# Patient Record
Sex: Female | Born: 1945 | Race: Black or African American | Hispanic: No | Smoking: Never smoker
Health system: Southern US, Community
[De-identification: ages and names within clinical notes are randomized; demographics above are authoritative.]

## PROBLEM LIST (undated history)

## (undated) DIAGNOSIS — E669 Obesity, unspecified: Secondary | ICD-10-CM

## (undated) DIAGNOSIS — K219 Gastro-esophageal reflux disease without esophagitis: Secondary | ICD-10-CM

## (undated) DIAGNOSIS — Z860100 Personal history of colon polyps, unspecified: Secondary | ICD-10-CM

## (undated) DIAGNOSIS — F329 Major depressive disorder, single episode, unspecified: Secondary | ICD-10-CM

## (undated) DIAGNOSIS — F419 Anxiety disorder, unspecified: Secondary | ICD-10-CM

## (undated) HISTORY — DX: Anxiety disorder, unspecified: F41.9

## (undated) HISTORY — PX: CARPAL TUNNEL RELEASE: SHX101

## (undated) HISTORY — DX: Personal history of colon polyps, unspecified: Z86.0100

## (undated) HISTORY — DX: Major depressive disorder, single episode, unspecified: F32.9

## (undated) HISTORY — DX: Obesity, unspecified: E66.9

## (undated) HISTORY — DX: Personal history of colonic polyps: Z86.010

## (undated) HISTORY — PX: TUBAL LIGATION: SHX77

## (undated) HISTORY — DX: Gastro-esophageal reflux disease without esophagitis: K21.9

---

## 1999-11-07 ENCOUNTER — Other Ambulatory Visit: Admission: RE | Admit: 1999-11-07 | Discharge: 1999-11-07 | Payer: Self-pay | Admitting: Obstetrics and Gynecology

## 2000-04-27 ENCOUNTER — Encounter: Payer: Self-pay | Admitting: Emergency Medicine

## 2000-04-27 ENCOUNTER — Encounter: Admission: RE | Admit: 2000-04-27 | Discharge: 2000-04-27 | Payer: Self-pay | Admitting: Emergency Medicine

## 2001-08-05 ENCOUNTER — Encounter: Payer: Self-pay | Admitting: Internal Medicine

## 2002-12-21 ENCOUNTER — Ambulatory Visit (HOSPITAL_COMMUNITY): Admission: RE | Admit: 2002-12-21 | Discharge: 2002-12-21 | Payer: Self-pay | Admitting: Endocrinology

## 2002-12-30 ENCOUNTER — Encounter (INDEPENDENT_AMBULATORY_CARE_PROVIDER_SITE_OTHER): Payer: Self-pay | Admitting: *Deleted

## 2002-12-30 ENCOUNTER — Ambulatory Visit (HOSPITAL_COMMUNITY): Admission: RE | Admit: 2002-12-30 | Discharge: 2002-12-30 | Payer: Self-pay | Admitting: Endocrinology

## 2002-12-30 ENCOUNTER — Encounter: Payer: Self-pay | Admitting: Internal Medicine

## 2004-03-03 ENCOUNTER — Encounter: Admission: RE | Admit: 2004-03-03 | Discharge: 2004-03-03 | Payer: Self-pay | Admitting: Emergency Medicine

## 2005-01-27 HISTORY — PX: OTHER SURGICAL HISTORY: SHX169

## 2005-03-31 ENCOUNTER — Ambulatory Visit: Payer: Self-pay | Admitting: Gastroenterology

## 2005-04-11 ENCOUNTER — Encounter: Payer: Self-pay | Admitting: Internal Medicine

## 2005-04-11 ENCOUNTER — Encounter (INDEPENDENT_AMBULATORY_CARE_PROVIDER_SITE_OTHER): Payer: Self-pay | Admitting: *Deleted

## 2005-04-11 ENCOUNTER — Ambulatory Visit: Payer: Self-pay | Admitting: Gastroenterology

## 2005-04-11 LAB — HM COLONOSCOPY

## 2005-10-08 ENCOUNTER — Encounter: Admission: RE | Admit: 2005-10-08 | Discharge: 2005-10-08 | Payer: Self-pay | Admitting: Emergency Medicine

## 2005-11-06 ENCOUNTER — Encounter: Admission: RE | Admit: 2005-11-06 | Discharge: 2005-11-06 | Payer: Self-pay | Admitting: Emergency Medicine

## 2006-01-27 HISTORY — PX: REPLACEMENT TOTAL KNEE: SUR1224

## 2006-03-18 ENCOUNTER — Encounter: Admission: RE | Admit: 2006-03-18 | Discharge: 2006-03-18 | Payer: Self-pay | Admitting: Emergency Medicine

## 2006-08-04 ENCOUNTER — Encounter: Admission: RE | Admit: 2006-08-04 | Discharge: 2006-08-04 | Payer: Self-pay | Admitting: Emergency Medicine

## 2006-08-27 ENCOUNTER — Ambulatory Visit: Payer: Self-pay

## 2007-03-26 ENCOUNTER — Encounter: Payer: Self-pay | Admitting: Internal Medicine

## 2007-05-27 ENCOUNTER — Encounter: Admission: RE | Admit: 2007-05-27 | Discharge: 2007-05-27 | Payer: Self-pay | Admitting: Emergency Medicine

## 2008-01-14 ENCOUNTER — Encounter: Payer: Self-pay | Admitting: Internal Medicine

## 2008-04-21 DIAGNOSIS — Z860100 Personal history of colon polyps, unspecified: Secondary | ICD-10-CM | POA: Insufficient documentation

## 2008-05-11 ENCOUNTER — Ambulatory Visit: Payer: Self-pay | Admitting: Internal Medicine

## 2008-05-12 ENCOUNTER — Ambulatory Visit: Payer: Self-pay | Admitting: Internal Medicine

## 2008-05-12 LAB — CONVERTED CEMR LAB: Vit D, 25-Hydroxy: 35 ng/mL (ref 30–89)

## 2008-05-15 LAB — CONVERTED CEMR LAB
ALT: 11 units/L (ref 0–35)
AST: 19 units/L (ref 0–37)
Albumin: 4.1 g/dL (ref 3.5–5.2)
Alkaline Phosphatase: 67 units/L (ref 39–117)
BUN: 15 mg/dL (ref 6–23)
Basophils Absolute: 0 10*3/uL (ref 0.0–0.1)
Basophils Relative: 0.9 % (ref 0.0–3.0)
Bilirubin Urine: NEGATIVE
Bilirubin, Direct: 0.1 mg/dL (ref 0.0–0.3)
CO2: 31 meq/L (ref 19–32)
Calcium: 9.4 mg/dL (ref 8.4–10.5)
Chloride: 109 meq/L (ref 96–112)
Cholesterol: 189 mg/dL (ref 0–200)
Creatinine, Ser: 0.6 mg/dL (ref 0.4–1.2)
Eosinophils Absolute: 0.1 10*3/uL (ref 0.0–0.7)
Eosinophils Relative: 1.4 % (ref 0.0–5.0)
GFR calc non Af Amer: 130.04 mL/min (ref >60)
Glucose, Bld: 98 mg/dL (ref 70–99)
HCT: 38.8 % (ref 36.0–46.0)
HDL: 52.2 mg/dL (ref >39.00)
Hemoglobin, Urine: NEGATIVE
Hemoglobin: 12.8 g/dL (ref 12.0–15.0)
Ketones, ur: NEGATIVE mg/dL
LDL Cholesterol: 129 mg/dL — ABNORMAL HIGH (ref 0–99)
Leukocytes, UA: NEGATIVE
Lymphocytes Relative: 33 % (ref 12.0–46.0)
Lymphs Abs: 1.8 10*3/uL (ref 0.7–4.0)
MCHC: 33.1 g/dL (ref 30.0–36.0)
MCV: 85.6 fL (ref 78.0–100.0)
Monocytes Absolute: 0.4 10*3/uL (ref 0.1–1.0)
Monocytes Relative: 7.9 % (ref 3.0–12.0)
Neutro Abs: 3.1 10*3/uL (ref 1.4–7.7)
Neutrophils Relative %: 56.8 % (ref 43.0–77.0)
Nitrite: NEGATIVE
Platelets: 333 10*3/uL (ref 150.0–400.0)
Potassium: 4.5 meq/L (ref 3.5–5.1)
RBC: 4.53 M/uL (ref 3.87–5.11)
RDW: 12.4 % (ref 11.5–14.6)
Sodium: 145 meq/L (ref 135–145)
Specific Gravity, Urine: >=1.03 (ref 1.000–1.030)
TSH: 0.76 microintl units/mL (ref 0.35–5.50)
Total Bilirubin: 0.4 mg/dL (ref 0.3–1.2)
Total CHOL/HDL Ratio: 4
Total Protein: 6.9 g/dL (ref 6.0–8.3)
Triglycerides: 38 mg/dL (ref 0.0–149.0)
Urine Glucose: NEGATIVE mg/dL
Urobilinogen, UA: 0.2 (ref 0.0–1.0)
VLDL: 7.6 mg/dL (ref 0.0–40.0)
WBC: 5.4 10*3/uL (ref 4.5–10.5)
pH: 6 (ref 5.0–8.0)

## 2008-07-20 ENCOUNTER — Encounter (INDEPENDENT_AMBULATORY_CARE_PROVIDER_SITE_OTHER): Payer: Self-pay | Admitting: *Deleted

## 2008-07-20 ENCOUNTER — Ambulatory Visit: Payer: Self-pay | Admitting: Endocrinology

## 2008-07-20 DIAGNOSIS — M25579 Pain in unspecified ankle and joints of unspecified foot: Secondary | ICD-10-CM

## 2008-07-20 DIAGNOSIS — M79609 Pain in unspecified limb: Secondary | ICD-10-CM | POA: Insufficient documentation

## 2008-07-25 ENCOUNTER — Telehealth: Payer: Self-pay | Admitting: Internal Medicine

## 2008-08-16 ENCOUNTER — Telehealth (INDEPENDENT_AMBULATORY_CARE_PROVIDER_SITE_OTHER): Payer: Self-pay | Admitting: *Deleted

## 2008-08-17 ENCOUNTER — Telehealth (INDEPENDENT_AMBULATORY_CARE_PROVIDER_SITE_OTHER): Payer: Self-pay | Admitting: *Deleted

## 2008-08-18 ENCOUNTER — Ambulatory Visit: Payer: Self-pay | Admitting: Internal Medicine

## 2008-08-18 LAB — CONVERTED CEMR LAB: Uric Acid, Serum: 4.4 mg/dL (ref 2.4–7.0)

## 2008-08-21 ENCOUNTER — Ambulatory Visit: Payer: Self-pay | Admitting: Internal Medicine

## 2009-01-01 ENCOUNTER — Ambulatory Visit: Payer: Self-pay | Admitting: Internal Medicine

## 2009-01-10 ENCOUNTER — Ambulatory Visit: Payer: Self-pay | Admitting: Internal Medicine

## 2009-01-10 DIAGNOSIS — J40 Bronchitis, not specified as acute or chronic: Secondary | ICD-10-CM | POA: Insufficient documentation

## 2009-01-15 ENCOUNTER — Telehealth: Payer: Self-pay | Admitting: Internal Medicine

## 2009-01-17 ENCOUNTER — Encounter: Payer: Self-pay | Admitting: Internal Medicine

## 2009-04-05 ENCOUNTER — Encounter: Payer: Self-pay | Admitting: Internal Medicine

## 2009-05-21 ENCOUNTER — Ambulatory Visit: Payer: Self-pay | Admitting: Internal Medicine

## 2009-05-21 DIAGNOSIS — E669 Obesity, unspecified: Secondary | ICD-10-CM

## 2009-06-15 ENCOUNTER — Telehealth: Payer: Self-pay | Admitting: Internal Medicine

## 2009-08-23 ENCOUNTER — Ambulatory Visit: Payer: Self-pay | Admitting: Internal Medicine

## 2009-08-23 DIAGNOSIS — F329 Major depressive disorder, single episode, unspecified: Secondary | ICD-10-CM | POA: Insufficient documentation

## 2009-08-23 DIAGNOSIS — K219 Gastro-esophageal reflux disease without esophagitis: Secondary | ICD-10-CM | POA: Insufficient documentation

## 2009-08-23 DIAGNOSIS — M25529 Pain in unspecified elbow: Secondary | ICD-10-CM

## 2009-09-06 ENCOUNTER — Ambulatory Visit: Payer: Self-pay | Admitting: Internal Medicine

## 2009-11-08 ENCOUNTER — Ambulatory Visit: Payer: Self-pay | Admitting: Internal Medicine

## 2010-02-16 ENCOUNTER — Encounter: Payer: Self-pay | Admitting: Emergency Medicine

## 2010-02-17 ENCOUNTER — Encounter: Payer: Self-pay | Admitting: Emergency Medicine

## 2010-02-26 NOTE — Assessment & Plan Note (Signed)
Summary: PER PT 2 WK FU---STC   Vital Signs:  Patient profile:   65 year old female Height:      66 inches (167.64 cm) Weight:      214 pounds (97.27 kg) O2 Sat:      95 % on Room air Temp:     98.2 degrees F (36.78 degrees C) oral Pulse rate:   68 / minute BP sitting:   120 / 82  (left arm) Cuff size:   large  Vitals Entered By: Orlan Leavens RMA (September 06, 2009 9:09 AM)  O2 Flow:  Room air CC: 2 week follow-up Is Patient Diabetic? No   Primary Care Provider:  Newt Lukes MD  CC:  2 week follow-up.  History of Present Illness: here followup on 3 concerns:  right elbow pain       problem began 05/2009.  On a scale of 1 to 10, the intensity is described as a mild, but persistant.  Precipitated by direct trauma to elbow tip when she hit against edge of furniture.  The patient reports improvement in swelling, tenderness, and numbness, initially severe, now very mild. began voltaren gel 07/2009 for same  Heartburn started on PPI in place of H2B for same 2 weeks ago: not taking omeprazole because had nexium samples  - no change in symptoms  Depressive Symptoms      The patient also presents with Depressive symptoms.  The symptoms began 04/2009.  The severity is described as moderate.  Believes she has had hx of same but never on medications or counseling.  The patient reports tearful spells, depressed mood, loss of interest/pleasure, and insomnia, but denies significant weight loss, significant weight gain, hypersomnia, and psychomotor agitation.  The patient also reports fatigue or loss of energy, feelings of worthlessness, and diminished concentration.  The patient denies thoughts of death, thoughts of suicide, suicidal intent, and suicidal plans.  The patient reports the following psychosocial stressors: major life changes.  Patient's past history includes family hx of depression.  The patient denies abnormally elevated mood, abnormally irritable mood, decreased need for sleep,  distractibility, and flight of ideas.   rx'd citalopram and xanax for same - not taking either due to fear of side effects -  wants weight loss drug - believe weight loss will make everything feel better  Clinical Review Panels:  CBC   WBC:  5.4 (05/12/2008)   RBC:  4.53 (05/12/2008)   Hgb:  12.8 (05/12/2008)   Hct:  38.8 (05/12/2008)   Platelets:  333.0 (05/12/2008)   MCV  85.6 (05/12/2008)   MCHC  33.1 (05/12/2008)   RDW  12.4 (05/12/2008)   PMN:  56.8 (05/12/2008)   Lymphs:  33.0 (05/12/2008)   Monos:  7.9 (05/12/2008)   Eosinophils:  1.4 (05/12/2008)   Basophil:  0.9 (05/12/2008)  Complete Metabolic Panel   Glucose:  98 (05/12/2008)   Sodium:  145 (05/12/2008)   Potassium:  4.5 (05/12/2008)   Chloride:  109 (05/12/2008)   CO2:  31 (05/12/2008)   BUN:  15 (05/12/2008)   Creatinine:  0.6 (05/12/2008)   Albumin:  4.1 (05/12/2008)   Total Protein:  6.9 (05/12/2008)   Calcium:  9.4 (05/12/2008)   Total Bili:  0.4 (05/12/2008)   Alk Phos:  67 (05/12/2008)   SGPT (ALT):  11 (05/12/2008)   SGOT (AST):  19 (05/12/2008)   Current Medications (verified): 1)  Elocon 0.1 % Crea (Mometasone Furoate) .... Apply As Needed Itch 2)  Ibuprofen 800 Mg  Tabs (Ibuprofen) .Marland Kitchen.. 1 By Mouth Two Times A Day As Needed For Pain 3)  Omeprazole 20 Mg Cpdr (Omeprazole) .Marland Kitchen.. 1 By Mouth Once Daily 4)  Citalopram Hydrobromide 10 Mg Tabs (Citalopram Hydrobromide) .Marland Kitchen.. 1 By Mouth Once Daily X 7 Days, Then Increase To 2 By Mouth Once Daily 5)  Voltaren 1 % Gel (Diclofenac Sodium) .... Apply To Right Elbow Pain Three Times A Day As Needed For Pain Symptoms 6)  Alprazolam 0.5 Mg Tabs (Alprazolam) .Marland Kitchen.. 1 By Mouth Every 8 Hours As Needed For Nerves And/or Sleep  Allergies (verified): 1)  ! Penicillin 2)  ! Codeine  Past History:  Past Medical History: Colonic polyps, hx of GERD  obesity depression/anxiety  MD roster: ortho - paul  Review of Systems  The patient denies fever, weight gain,  chest pain, and headaches.    Physical Exam  General:  alert, well-developed, well-nourished, and cooperative to examination.   Lungs:  normal respiratory effort, no intercostal retractions or use of accessory muscles; normal breath sounds bilaterally - no crackles and no wheezes.    Heart:  normal rate, regular rhythm, no murmur, and no rub. BLE without edema. Psych:  Oriented X3, memory intact for recent and remote, normally interactive, good eye contact, not anxious appearing, mildly depressed appearing, and not agitated.      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 1 by mouth once daily x 7 days, then increase to 2 by mouth once daily    Alprazolam 0.5 Mg Tabs (Alprazolam) .Marland Kitchen... 1 by mouth every 8 hours as needed for nerves and/or sleep  "nerves" are pt primary concern, keeps her from sleeping at night - reassured re: med safter and encouraged to start SSRI and small dose BZ to use as needed - again offered referral to behav health for counseling - declines at this time f/u in 4-6 weeks to cont counseling and med adjustments as needed   Problem # 3:  GERD (ICD-530.81)  Her updated medication list for this problem includes:    Omeprazole 20 Mg Cpdr (Omeprazole) ..... Hold for now  tried change H2B to PPI - ?GERD exac by regular high dose ibuprofen for OA - continue samples of current med as tol - no further eval at this time  Labs Reviewed: Hgb: 12.8 (05/12/2008)   Hct: 38.8 (05/12/2008)  Problem # 4:  OBESITY (ICD-278.00)  will look into info re: lap band info  meetings ok to try month of phenteramine per pt request - will not rx more than total of 3 months - potential risk/benefit explained to pt who agrees to same and expresses understanding  Ht: 66 (05/21/2009)   Wt: 212.4 (05/21/2009)   BMI: 34.41 (05/21/2009)  Complete Medication List: 1)  Elocon 0.1 % Crea (Mometasone furoate) .... Apply as needed itch 2)  Ibuprofen 800 Mg Tabs (Ibuprofen) .Marland Kitchen.. 1 by mouth two times a day as needed for pain 3)  Omeprazole 20 Mg Cpdr (Omeprazole) .... Hold for now 4)  Citalopram Hydrobromide 10 Mg Tabs (Citalopram hydrobromide) .Marland Kitchen.. 1 by mouth once daily x 7 days, then increase  to 2 by mouth once daily 5)  Voltaren 1 % Gel (Diclofenac sodium) .... Apply to right elbow pain three times a day as needed for pain symptoms 6)  Alprazolam 0.5 Mg Tabs (Alprazolam) .Marland Kitchen.. 1 by mouth every 8 hours as needed for nerves and/or sleep 7)  Phentermine Hcl 37.5 Mg Caps (Phentermine hcl) .Marland Kitchen.. 1 by mouth once daily for appetite suppression  Patient Instructions: 1)  it was good to see you today. 2)  for your reflux, continue pepcid and hold omeprazole for now 3)  for nerves and depression, please start citalopram every day (1 once daily x 7 days, then 2 once daily). may also use low dose alprazolam (xanax) as needed for nerve spells and sleep 4)  for your elbow, shoulder and knee: may continue to use voltaren gel as discussed -  5)  your prescription for weight loss and appetite suppression (phenteramine) has been printed and given to you to submit to your pharmacy. Please take as directed. Contact our office if you believe you're having problems with the medication(s).  6)  Please schedule a follow-up appointment in 4 weeks to review weight loss and folloup on medications, call sooner if needed.  Prescriptions: PHENTERMINE HCL 37.5 MG CAPS (PHENTERMINE HCL) 1 by mouth once daily for appetite suppression  #30 x 0   Entered and Authorized by:   Newt Lukes MD   Signed by:    Newt Lukes MD on 09/06/2009   Method used:   Print then Give to Patient   RxID:   [id number]

## 2010-02-26 NOTE — Assessment & Plan Note (Signed)
Summary: FOOT HURTING--$50--[facility]   Vital Signs:  Patient profile:   65 year old female Height:      66 inches Weight:      210 pounds BMI:     34.02 Temp:     97.6 degrees F oral Pulse rate:   75 / minute BP sitting:   122 / 80  (left arm) Cuff size:   large  Vitals Entered By: Bill Salinas CMA (July 20, 2008 2:42 PM) CC: pt co pain in her Right foot x 9 days/ ab   CC:  pt co pain in her Right foot x 9 days/ ab.  History of Present Illness: pt states 9 days of moderate pain at the right instep.  no associated injury. she frequently takes vicodin--no help.  Current Medications (verified): 1)  Nexium 40 Mg Cpdr (Esomeprazole Magnesium) .... Take 1 By Mouth Qd 2)  Multivitamins  Tabs (Multiple Vitamin) .... Take 1 By Mouth Qd 3)  Folic Acid 400 Mcg Tabs (Folic Acid) .... Tale 1 By Mouth Qd 4)  Hydrocodone-Acetaminophen 5-500 Mg Tabs (Hydrocodone-Acetaminophen) .... Take 1 Tablet By Mouth Four Times A Day As Needed Pain 5)  Elocon 0.1 % Crea (Mometasone Furoate) .... Apply As Needed Itch  Allergies (verified): 1)  ! Penicillin 2)  ! Codeine  Past History:  Past Medical History: Last updated: 04/21/2008 Colonic polyps, hx of  Review of Systems       denies numbness  Physical Exam  General:  normal appearance.   Additional Exam:  x rays are neg   Impression & Recommendations:  Problem # 1:  FOOT PAIN, RIGHT (ICD-729.5) uncertain etiology  Problem # 2:  ANKLE PAIN, RIGHT (ICD-719.47) uncertain etiology  Other Orders: TLB-Uric Acid, Blood (84550-URIC) TLB-Sedimentation Rate (ESR) (85652-ESR) T-Foot Right (73630TC) T-Ankle Comp Right (64403KV) Orthopedic Surgeon Referral (Ortho Surgeon) Est. Patient Level IV (42595)  Patient Instructions: 1)  arthrotec 75 mg two times a day as needed pain.  you should not take any other pain medication with this 2)  ref ortho 3)  tests are being ordered for you today.  a few days after the test(s), please call [phone]  to hear your test results.  this is very important to do because the results may change the instructions you see here 4)  (update: i left message on phone-tree:  rx as we discussed)

## 2010-02-26 NOTE — Letter (Signed)
Summary: [facility]  [facility]   Imported By: Sherian Rein 04/12/2009 14:05:58  _____________________________________________________________________  External Attachment:    Type:   Image     Comment:   External Document

## 2010-02-26 NOTE — Assessment & Plan Note (Signed)
Summary: CHEST CONGESTION-HEADACHE--STC   Vital Signs:  Patient profile:   65 year old female Height:      66 inches (167.64 cm) Weight:      212.4 pounds (96.55 kg) BMI:     34.41 O2 Sat:      97 % on Room air Temp:     98.4 degrees F (36.89 degrees C) oral Pulse rate:   85 / minute BP sitting:   140 / 96  (left arm) Cuff size:   large  Vitals Entered By: Orlan Leavens (May 21, 2009 4:06 PM)  O2 Flow:  Room air CC: Ongoing dry cough Is Patient Diabetic? No Pain Assessment Patient in pain? no        Primary Care Provider:  Newt Lukes MD  CC:  Ongoing dry cough.  History of Present Illness: here today with complaint of cough and chest congestion. onset of symptoms was 2 weeks ago; similar to prior "chest colds" this year - course has been gradual onset and now occurs in intermittent waxing/waning pattern - better then worse, repeat. symptom characterized as nonproductive cough and head ache problem associated with chills and fatigue but not associated with fever or recent travel. symptoms improved by nothing -  Robitussin, Mucinex, contax(?). previously treated with tessalon and zpack - slow improvement symptoms worsened with lying flat at night - causes increased coughing and congestion. denies prior hx of same symptoms before last fall 2010  also ? if eligible for lap band to help with weight loss - follows periodically with bariatric clinic in [facility] - meds intermitt, diet unsuccessful  Current Medications (verified): 1)  Nexium 40 Mg Cpdr (Esomeprazole Magnesium) .... Take 1 By Mouth Qd 2)  Multivitamins  Tabs (Multiple Vitamin) .... Take 1 By Mouth Qd 3)  Folic Acid 400 Mcg Tabs (Folic Acid) .... Tale 1 By Mouth Qd 4)  Hydrocodone-Acetaminophen 5-500 Mg Tabs (Hydrocodone-Acetaminophen) .... Take 1 Tablet By Mouth Four Times A Day As Needed Pain 5)  Elocon 0.1 % Crea (Mometasone Furoate) .... Apply As Needed Itch 6)  Mobic 15 Mg Tabs (Meloxicam) ....  Take 1/2-1 By Mouth Once Daily 7)  Celebrex 100 Mg Caps (Celecoxib) .... Take 1 By Mouth Qd  Allergies (verified): 1)  ! Penicillin 2)  ! Codeine  Past History:  Past Medical History: Colonic polyps, hx of GERD obesity  MD rooster: ortho - paul  Review of Systems       The patient complains of hoarseness and prolonged cough.  The patient denies fever, weight loss, and hemoptysis.    Physical Exam  General:  alert, well-developed, well-nourished, and cooperative to examination.   mod ill, flushed and clammy with constant shallow cough Lungs:  +exp wheeze, shallow spont breathing limited by cough - but no crackles bilaterally Heart:  normal rate, regular rhythm, no murmur, and no rub. BLE without edema.    Impression & Recommendations:  Problem # 1:  BRONCHITIS (ICD-490)  recurrent symptoms in nonsmoker -  again offered CXR and pt declines - ?need PFTs - tx with abx given productive sputum and head congestion and aggressive cough suppression to include PPI for contrib of silent GERD - steroid shot for wheeze - solumedrol done The following medications were removed from the medication list:    Tussionex Pennkinetic Er 8-10 Mg/24ml Lqcr (Chlorpheniramine-hydrocodone) .Marland Kitchen... 1 tsp by mouth every 12h orn for cough    Tessalon Perles 100 Mg Caps (Benzonatate) .Marland Kitchen... 1 by mouth three times a day  as needed for cough Her updated medication list for this problem includes:    Doxycycline Hyclate 100 Mg Caps (Doxycycline hyclate) .Marland Kitchen... 1 by mouth two times a day x 7 days    Tessalon Perles 100 Mg Caps (Benzonatate) .Marland Kitchen... 1 by mouth three times a day x 7 days, then as needed for cough  Orders: Solumedrol up to 125mg  (Z6109) Admin of Therapeutic Inj  intramuscular or subcutaneous (60454) Prescription Created Electronically [phone])  Take antibiotics and other medications as directed. Encouraged to push clear liquids, get enough rest, and take acetaminophen as needed. To be seen in 5-7  days if no improvement, sooner if worse.  Problem # 2:  OBESITY (ICD-278.00) will look into info re: lap band info meetings  Ht: 66 (05/21/2009)   Wt: 212.4 (05/21/2009)   BMI: 34.41 (05/21/2009)  Complete Medication List: 1)  Pepcid 20 Mg Tabs (Famotidine) .Marland Kitchen.. 1 by mouth two times a day x 10days, then once daily as needed for reflux or cough 2)  Multivitamins Tabs (Multiple vitamin) .... Take 1 by mouth qd 3)  Folic Acid 400 Mcg Tabs (Folic acid) .... Tale 1 by mouth qd 4)  Hydrocodone-acetaminophen 5-500 Mg Tabs (Hydrocodone-acetaminophen) .... Take 1 tablet by mouth four times a day as needed pain 5)  Elocon 0.1 % Crea (Mometasone furoate) .... Apply as needed itch 6)  Ibuprofen 800 Mg Tabs (Ibuprofen) .Marland Kitchen.. 1 by mouth two times a day as needed for pain 7)  Doxycycline Hyclate 100 Mg Caps (Doxycycline hyclate) .Marland Kitchen.. 1 by mouth two times a day x 7 days 8)  Tessalon Perles 100 Mg Caps (Benzonatate) .Marland Kitchen.. 1 by mouth three times a day x 7 days, then as needed for cough  Patient Instructions: 1)  it was good to see you today.  2)  shot of solumedrol given today for wheeze - 3)  start doxycycline antibitoics x 7 days, tessalon perles for cough and pepcid for reflux exacerbating the cough - your prescriptions have been electronically submitted to your pharmacy. Please take as directed. Contact our office if you believe you're having problems with the medication(s).  4)  will look into informational meetings regarding lap band information - Our office will contact you regarding this information once obtained.  [facility] [phone] for information 5)  refill on ibuprofen as discussed Prescriptions: TESSALON PERLES 100 MG CAPS (BENZONATATE) 1 by mouth three times a day x 7 days, then as needed for cough  #30 x 1   Entered and Authorized by:   Newt Lukes MD   Signed by:   Newt Lukes MD on 05/21/2009   Method used:   Electronically to        [facility]   [phone]* (retail)       [address] [place], [place]  [address]       Ph: [phone] or [phone]       Fax: [phone]   RxID:   [id number] DOXYCYCLINE HYCLATE 100 MG CAPS (DOXYCYCLINE HYCLATE) 1 by mouth two times a day x 7 days  #14 x 0   Entered and Authorized by:   Newt Lukes MD   Signed by:   Newt Lukes MD on 05/21/2009   Method used:   [facility]        [facility]  [address]  [phone]* (retail)       [address] [place], [place]  [address]       Ph: [phone] or [phone]       Fax: [phone]   RxID:   [id number] IBUPROFEN 800 MG TABS (IBUPROFEN) 1 by mouth two times a day as needed for pain  #60 x 1   Entered and Authorized by:   Newt Lukes MD   Signed by:   Newt Lukes MD on 05/21/2009   Method used:   [facility]ically to        [facility]  [address]  [phone]* (retail)       [address] [place], [place]  [address]       Ph: [phone] or [phone]       Fax: [phone]   RxID:   [id number] PEPCID 20 MG TABS (FAMOTIDINE) 1 by mouth two times a day x 10days, then once daily as needed for reflux or cough  #60 x 1   Entered and Authorized by:   Newt Lukes MD   Signed by:   Newt Lukes MD on 05/21/2009   Method used:   [facility]        [facility]  [address]  [phone]* (retail)       [address] [place], [place]  [address]       Ph: [phone] or [phone]       Fax: [phone]   RxID:   [id number]    Medication Administration  Injection # 1:    Medication: Solumedrol up to 125mg     Diagnosis: BRONCHITIS (ICD-490)    Route: IM    Site: LUOQ gluteus    Exp Date: 03/2010    Lot #: [id number]    Mfr: [employer]    Patient tolerated injection without complications    Given by: Orlan Leavens (May 21, 2009 4:48 PM)  Orders Added: 1)  Solumedrol up to 125mg  [J2930] 2)  Admin of Therapeutic Inj  intramuscular or subcutaneous [96372] 3)  Est. Patient Level IV  [71245] 4)  Prescription Created Electronically [phone]

## 2010-02-26 NOTE — Assessment & Plan Note (Signed)
Summary: NEW PT $50-PKG-UHC---STC   Vital Signs:  Patient profile:   65 year old female Height:      66 inches Weight:      215.38 pounds BMI:     34.89 O2 Sat:      94 % Temp:     98.6 degrees F oral Pulse rate:   69 / minute BP sitting:   122 / 76  (left arm) Cuff size:   large  Vitals Entered By: Windell Norfolk (May 11, 2008 8:06 AM)  Nutrition Counseling: Patient's BMI is greater than 25 and therefore counseled on weight management options. CC: NEW PATIENT   CC:  NEW PATIENT.  History of Present Illness: 65 yo BF - here te est care - previously followed with Dr. Lorenz Coaster. feels well - "just i'm too fat" beginning to walk again - [place] 8[place]09 has no complaints today. Denies chest pain, dyspnea on exertion, headache. No weight changes, no nausea or vomiting. No change in bowel habits. Reports no history of heart disease or stroke. Would like not to continue Pap smears at this point in her life. No history of abnormal Pap smears. No family history of ovarian cancer or personal history of pelvic problems. she also questions her need or benefit from Zostavax She requests refill availability for p.r.n. Elocon cream and hydrocodone (she has handwritten prescription from December 2009 for each of these which have not yet been filled)  Preventive Screening-Counseling & Management     Alcohol drinks/day: 0     Smoking Status: never     Tobacco Counseling: not indicated; no tobacco use     Does Patient Exercise: yes     Type of exercise: walking     Exercise (avg: min/session): <30     Times/week: 3     Exercise Counseling: to improve exercise regimen  Current Medications (verified): 1)  Nexium 40 Mg Cpdr (Esomeprazole Magnesium) .... Take 1 By Mouth Qd 2)  Multivitamins  Tabs (Multiple Vitamin) .... Take 1 By Mouth Qd 3)  Folic Acid 400 Mcg Tabs (Folic Acid) .... Tale 1 By Mouth Qd 4)  Hydrocodone-Acetaminophen 5-500 Mg Tabs (Hydrocodone-Acetaminophen) ....  Take 1 Tablet By Mouth Four Times A Day As Needed Pain 5)  Elocon 0.1 % Crea (Mometasone Furoate) .... Apply As Needed Itch  Allergies (verified): 1)  ! Penicillin 2)  ! Codeine  Past History:  Past Medical History:    Reviewed history from 04/21/2008 and no changes required:    Colonic polyps, hx of  Past Surgical History:    Reviewed history from 04/21/2008 and no changes required:    Carpal tunnel release    Tubal ligation  Family History:    mom - breast cancer age 71, HTN    dad deceased - HTN, DM  Social History:    lives alone - divorced    retired Conservator, [employer]    nonsmoker    flea market dealer/buyer    no EtOH    Smoking Status:  never    Does Patient Exercise:  yes  Review of Systems       see HPI above. I have reviewed all other systems and they were negative.   Physical Exam  General:  alert, well-developed, well-nourished, and cooperative to examination.  overweight-appearing.   Head:  Normocephalic and atraumatic without obvious abnormalities. No apparent alopecia or balding. Eyes:  vision grossly intact; pupils equal, round and reactive to light.  conjunctiva and lids normal.  Ears:  External ear exam shows no significant lesions or deformities.  Otoscopic examination reveals clear canals, tympanic membranes are intact bilaterally without bulging, retraction, inflammation or discharge. Hearing is grossly normal bilaterally. Nose:  External nasal examination shows no deformity or inflammation. Nasal mucosa are pink and moist without lesions or exudates. Mouth:  Oral mucosa and oropharynx without lesions or exudates.  Teeth in good repair. Neck:  supple, full ROM, no masses, no thyromegaly; no thyroid nodules or tenderness. no JVD or carotid bruits.   Chest Wall:  No deformities, masses, or tenderness noted. Lungs:  Normal respiratory effort, chest expands symmetrically. Lungs are clear to auscultation, no crackles or wheezes. Heart:  normal rate, regular  rhythm, no murmur, and no rub. BLE without edema. normal DP pulses and normal cap refill in all 4 extremities    Abdomen:  soft, non-tender, normal bowel sounds, no distention; no masses and no appreciable hepatomegaly or splenomegaly.   Pulses:  R and L carotid,radial,femoral,dorsalis pedis and posterior tibial pulses are full and equal bilaterally Extremities:  No clubbing, cyanosis, edema, or deformity noted with normal full range of motion of all joints.   Neurologic:  alert & oriented X3 and cranial nerves II-XII symetrically intact.  strength normal in all extremities, sensation intact to light touch, and gait normal. speech fluent without dysarthria or aphasia  follows commands with good comprehension.  Skin:  Intact without suspicious lesions or rashes Cervical Nodes:  No lymphadenopathy noted Axillary Nodes:  No palpable lymphadenopathy Psych:  Oriented X3, memory intact for recent and remote, normally interactive, good eye contact, not anxious appearing, not depressed appearing, and not agitated.   very pleasant   Impression & Recommendations:  Problem # 1:  Preventive Health Care (ICD-V70.0)  labs today (pt will return later today or AM to complete) up to date on mammogram, colo -  PAP done last year - always has been normal per pt so will not repeat  -but needs pelivic exam q2-3 yrs EKG next visit return for RN visit to have Zostavax   Discussed using sunscreen, use of alcohol, drug use, self breast exam, routine dental care, routine eye care, schedule for GYN exam, routine physical exam, seat belts, multiple vitamins, osteoporosis prevention, adequate calcium intake in diet, recommendations for immunizations, mammograms and Pap smears.  Discussed exercise and checking cholesterol.  Discussed gun safety, safe sex, and contraception.  Orders: TLB-BMP (Basic Metabolic Panel-BMET) (80048-METABOL) TLB-Lipid Panel (80061-LIPID) TLB-TSH (Thyroid Stimulating Hormone) (84443-TSH)  TLB-CBC Platelet - w/Differential (85025-CBCD) T-Vitamin D (25-Hydroxy) ([address]) TLB-Udip ONLY (81003-UDIP) TLB-Hepatic/Liver Function Pnl (80076-HEPATIC)  Complete Medication List: 1)  Nexium 40 Mg Cpdr (Esomeprazole magnesium) .... Take 1 by mouth qd 2)  Multivitamins Tabs (Multiple vitamin) .... Take 1 by mouth qd 3)  Folic Acid 400 Mcg Tabs (Folic acid) .... Tale 1 by mouth qd 4)  Hydrocodone-acetaminophen 5-500 Mg Tabs (Hydrocodone-acetaminophen) .... Take 1 tablet by mouth four times a day as needed pain 5)  Elocon 0.1 % Crea (Mometasone furoate) .... Apply as needed itch  Patient Instructions: 1)  Please schedule a follow-up appointment in 1 year or as needed. 2)  It is important that you exercise regularly at least 20 minutes 5 times a week. If you develop chest pain, have severe difficulty breathing, or feel very tired , stop exercising immediately and seek medical attention. 3)  You need to lose weight. Consider a lower calorie diet and regular exercise.  Prescriptions: ELOCON 0.1 % CREA (MOMETASONE FUROATE) apply as needed  itch  #15 g x 2   Entered and Authorized by:   Newt Lukes MD   Signed by:   Newt Lukes MD on 05/11/2008   Method used:   Historical   RxID:   [id number] HYDROCODONE-ACETAMINOPHEN 5-500 MG TABS (HYDROCODONE-ACETAMINOPHEN) Take 1 tablet by mouth four times a day as needed pain  #30 x 1   Entered and Authorized by:   Newt Lukes MD   Signed by:   Newt Lukes MD on 05/11/2008   Method used:   Historical   RxID:   [id number]   Appended Document: NEW PT $50-PKG-UHC---STC Called patient no ansew LMOM to RTC/LMB  Appended Document: NEW PT $50-PKG-UHC---STC Pt return she did have labs done, also gv shingle injection. [facility] spoke with Robin/LMB

## 2010-02-26 NOTE — Progress Notes (Signed)
Summary: cough meds  Phone Note Call from Patient Call back at Home Phone [phone]   Caller: Patient [phone] Summary of Call: pt called stating that cough syrup is too strong, has pt feeling "woozy" for days. pt is requesting an alt cough med as she is still coughing. Initial call taken by: Margaret Pyle, CMA,  January 15, 2009 11:33 AM  Follow-up for Phone Call        try tessalon perles - also use OTC robitussin 5-10 cc every 4 h as needed  for cough Follow-up by: Newt Lukes MD,  January 15, 2009 12:28 PM  Additional Follow-up for Phone Call Additional follow up Details #1::        pt informed Additional Follow-up by: Margaret Pyle, CMA,  January 15, 2009 1:10 PM    New/Updated Medications: TESSALON PERLES 100 MG CAPS (BENZONATATE) 1 by mouth three times a day as needed for cough Prescriptions: TESSALON PERLES 100 MG CAPS (BENZONATATE) 1 by mouth three times a day as needed for cough  #30 x 1   Entered and Authorized by:   Newt Lukes MD   Signed by:   Newt Lukes MD on 01/15/2009   Method used:   Electronically to        [facility]  [phone]* (retail)       [address] [place], [place]  [address]       Ph: [phone] or [phone]       Fax: [phone]   RxID:   [id number]

## 2010-02-26 NOTE — Assessment & Plan Note (Signed)
Summary: shingles/Taden Witter/cd  Nurse Visit     Allergies: 1)  ! Penicillin 2)  ! Codeine    Immunizations Administered:  Zostavax # 1:    Vaccine Type: Zostavax    Site: left deltoid    Mfr: Merck    Dose: 0.110ml    Route: IM    Given by: Orlan Leavens    Exp. Date: 04/20/2009    Lot #: [id number]    VIS given: 05/12/08   Orders Added: 1)  Zoster (Shingles) Vaccine Live [90736] 2)  Admin 1st Vaccine [[address]]

## 2010-02-26 NOTE — Progress Notes (Signed)
Summary: mometasone 0.1%  Phone Note Refill Request Message from:  Fax from Pharmacy on Jun 15, 2009 8:45 AM  Refills Requested: Medication #1:  ELOCON 0.1 % CREA apply as needed itch  Method Requested: Fax to Local Pharmacy Initial call taken by: Orlan Leavens,  Jun 15, 2009 8:46 AM    Prescriptions: ELOCON 0.1 % CREA (MOMETASONE FUROATE) apply as needed itch  #15 g x 5   Entered by:   Orlan Leavens   Authorized by:   Newt Lukes MD   Signed by:   Orlan Leavens on 06/15/2009   Method used:   [facility]ron[facility] to        [facility]  [phone]* (retail)       [address] [place], [place]  [address]       Ph: [phone] or [phone]       Fax: [phone]   RxID:   [id number]

## 2010-02-26 NOTE — Procedures (Signed)
Summary: Colonoscopy/[facility]  Colonoscopy/[facility]   Imported By: Lester [place] 06/19/2008 10:31:16  _____________________________________________________________________  External Attachment:    Type:   Image     Comment:   External Document

## 2010-02-26 NOTE — Assessment & Plan Note (Signed)
Summary: HEAD/CHEST CONGESTION--HEADACHE--STC   Vital Signs:  Patient profile:   65 year old female Height:      66 inches (167.64 cm) Weight:      203.6 pounds (92.55 kg) O2 Sat:      95 % on Room air Temp:     97.8 degrees F (36.56 degrees C) oral Pulse rate:   77 / minute BP sitting:   120 / 86  (left arm) Cuff size:   large  Vitals Entered By: Orlan Leavens RMA (November 08, 2009 11:25 AM)  O2 Flow:  Room air CC: Head & chest congestion Is Patient Diabetic? No Pain Assessment Patient in pain? no        Primary Care Provider:  Newt Lukes MD  CC:  Head & chest congestion.  History of Present Illness:  here today with complaint of cough and chest congestion. onset of symptoms was 1-2 weeks ago; similar to prior "chest colds" this year - course has been gradual onset and now occurs in intermittent waxing/waning pattern - symptom characterized as nonproductive cough and head ache problem associated with chills and fatigue but not associated with fever or recent travel. symptoms improved by nothing -  Robitussin, Mucinex, contax. previously treated with tessalon and zpack - slow improvement symptoms worsened with lying flat at night - causes increased coughing and congestion. denies prior hx of same symptoms before fall 2010   also followup chronic issues: right elbow pain       problem began 05/2009.  On a scale of 1 to 10, the intensity is described as a mild, but persistant.  Precipitated by direct trauma to elbow tip when she hit against edge of furniture.  The patient reports improvement in swelling, tenderness, and numbness, initially severe, now very mild. began voltaren gel 07/2009 for same - improved pain control  Heartburn started on PPI in place of H2B for same 07/2009: not taking omeprazole because had nexium samples  - no change in symptoms but inconsistent use of PPI  anxiety - too sleepy on alprazolam .25mg  during daytime use - inconsistent citalopram  - ?lower dose alprazolam to help with nerves  obesity - weight down 11# - unable to tol stimullants (phentermine) - eating less and exercising more -   Current Medications (verified): 1)  Elocon 0.1 % Crea (Mometasone Furoate) .... Apply As Needed Itch 2)  Ibuprofen 800 Mg Tabs (Ibuprofen) .Marland Kitchen.. 1 By Mouth Two Times A Day As Needed For Pain 3)  Omeprazole 20 Mg Cpdr (Omeprazole) .... Hold For Now 4)  Citalopram Hydrobromide 10 Mg Tabs (Citalopram Hydrobromide) .Marland Kitchen.. 1 By Mouth Once Daily X 7 Days, Then Increase To 2 By Mouth Once Daily 5)  Voltaren 1 % Gel (Diclofenac Sodium) .... Apply To Right Elbow Pain Three Times A Day As Needed For Pain Symptoms 6)  Alprazolam 0.5 Mg Tabs (Alprazolam) .Marland Kitchen.. 1 By Mouth Every 8 Hours As Needed For Nerves And/or Sleep  Allergies (verified): 1)  ! Penicillin 2)  ! Codeine  Past History:  Past Medical History: Colonic polyps, hx of GERD    obesity  depression/anxiety  MD roster: ortho - paul  Review of Systems       The patient complains of dyspnea on exertion.  The patient denies weight loss, peripheral edema, hemoptysis, and abdominal pain.    Physical Exam  General:  alert, well-developed, well-nourished, and cooperative to examination.   Eyes:  vision grossly intact; pupils equal, round and reactive to light.  conjunctiva and lids normal.    Ears:  normal pinnae bilaterally, without erythema, swelling, or tenderness to palpation. TMs clear, with min clear effusion, no cerumen impaction. Hearing grossly normal bilaterally  Mouth:  teeth and gums in good repair; mucous membranes moist, without lesions or ulcers. oropharynx clear without exudate, mod erythema.  Lungs:  normal respiratory effort, no intercostal retractions or use of accessory muscles; few rhonchi breath sounds bilaterally - no crackles and no wheezes.    Heart:  normal rate, regular rhythm, no murmur, and no rub. BLE without edema. Psych:  Oriented X3, memory intact for recent  and remote, normally interactive, good eye contact, not anxious appearing, mildly depressed appearing, and not agitated.      Impression & Recommendations:  Problem # 1:  BRONCHITIS (ICD-490)  prior tx tussionex too strong and tessalon ineffective - otc meds not helpful -  will tx steroids for antiinflam and emperic abx -  hydromet for cough suppression if tol - rx done Her updated medication list for this problem includes:    Hydromet 5-1.5 Mg/7ml Syrp (Hydrocodone-homatropine) .Marland Kitchen... 5cc by mouth every 4 hours as needed for cough    Azithromycin 250 Mg Tabs (Azithromycin) .Marland Kitchen... 2 tabs by mouth today, then 1 by mouth daily starting tomorrow  Orders: Depo- Medrol 80mg  (J1040) Depo- Medrol 40mg  (J1030) Admin of Therapeutic Inj  intramuscular or subcutaneous ([address])  Take antibiotics and other medications as directed. Encouraged to push clear liquids, get enough rest, and take acetaminophen as needed. To be seen in 5-7 days if no improvement, sooner if worse.  Problem # 2:  OBESITY (ICD-278.00)  will look into info re: lap band info meetings intol of phentermine 07/2009 trial - rx done prev per pt request encouraged to cont lifestyle changes with improved diet, portion control and exercise as ongoing  Ht: 66 (05/21/2009)   Wt: 212.4 (05/21/2009)   BMI: 34.41 (05/21/2009)  Ht: 66 (11/08/2009)   Wt: 203.6 (11/08/2009)   BMI: 34.41 (05/21/2009)  Problem # 3:  DEPRESSION (ICD-311)  The following medications were removed from the medication list:    Alprazolam 0.5 Mg Tabs (Alprazolam) .Marland Kitchen... 1 by mouth every 8 hours as needed for nerves and/or sleep Her updated medication list for this problem includes:    Citalopram Hydrobromide 10 Mg Tabs (Citalopram hydrobromide) .Marland Kitchen... 1 by mouth once daily x 7 days, then increase to 2 by mouth once daily    Alprazolam 0.25 Mg Tabs (Alprazolam) .Marland Kitchen... 1/2-1 by mouth every 12h as needed for nerves  "nerves" are pt primary concern, keeps her from  sleeping at night - reassured re: med safter and encouraged to start SSRI and smaller dose BZ to use as needed - again offered referral to behav health for counseling - declines again at this time  Complete Medication List: 1)  Elocon 0.1 % Crea (Mometasone furoate) .... Apply as needed itch 2)  Ibuprofen 800 Mg Tabs (Ibuprofen) .Marland Kitchen.. 1 by mouth two times a day as needed for pain 3)  Omeprazole 20 Mg Cpdr (Omeprazole) .... Hold for now 4)  Citalopram Hydrobromide 10 Mg Tabs (Citalopram hydrobromide) .Marland Kitchen.. 1 by mouth once daily x 7 days, then increase to 2 by mouth once daily 5)  Voltaren 1 % Gel (Diclofenac sodium) .... Apply to right elbow pain three times a day as needed for pain symptoms 6)  Hydromet 5-1.5 Mg/47ml Syrp (Hydrocodone-homatropine) .... 5cc by mouth every 4 hours as needed for cough 7)  Azithromycin 250 Mg Tabs (Azithromycin) .Marland KitchenMarland KitchenMarland Kitchen  2 tabs by mouth today, then 1 by mouth daily starting tomorrow 8)  Alprazolam 0.25 Mg Tabs (Alprazolam) .... 1/2-1 by mouth every 12h as needed for nerves  Patient Instructions: 1)  it was good to see you today. 2)  azithromycin and hydromet for bronchitis symptoms - your prescriptions have been given to you to submit to your pharmacy. Please take as directed. Contact our office if you believe you're having problems with the medication(s).  3)  also depo medrol steroid shot today 4)  Get plenty of rest, drink lots of clear liquids, and use Tylenol or Ibuprofen for fever and comfort. Return in 7-10 days if you're not better:sooner if you're feeling worse. Prescriptions: ALPRAZOLAM 0.25 MG TABS (ALPRAZOLAM) 1/2-1 by mouth every 12h as needed for nerves  #30 x 0   Entered and Authorized by:   Newt Lukes MD   Signed by:   Newt Lukes MD on 11/08/2009   Method used:   Print then Give to Patient   RxID:   [id number] AZITHROMYCIN 250 MG TABS (AZITHROMYCIN) 2 tabs by mouth today, then 1 by mouth daily starting tomorrow  #6 x 0   Entered  and Authorized by:   Newt Lukes MD   Signed by:   Newt Lukes MD on 11/08/2009   Method used:   Print then Give to Patient   RxID:   [id number] HYDROMET 5-1.5 MG/5ML SYRP (HYDROCODONE-HOMATROPINE) 5cc by mouth every 4 hours as needed for cough  #120cc x 0   Entered and Authorized by:   Newt Lukes MD   Signed by:   Newt Lukes MD on 11/08/2009   Method used:   Print then Give to Patient   RxID:   [id number]    Medication Administration  Injection # 1:    Medication: Depo- Medrol 80mg     Diagnosis: BRONCHITIS (ICD-490)    Route: IM    Site: LUOQ gluteus    Exp Date: 04/2012    Lot #: [id number]    Mfr: [employer]    Comments: Gave total of 120mg     Patient tolerated injection without complications    Given by: Orlan Leavens RMA (November 08, 2009 12:02 PM)  Injection # 2:    Medication: Depo- Medrol 40mg     Diagnosis: BRONCHITIS (ICD-490)  Orders Added: 1)  Depo- Medrol 80mg  [J1040] 2)  Depo- Medrol 40mg  [J1030] 3)  Admin of Therapeutic Inj  intramuscular or subcutaneous [96372] 4)  Est. Patient Level IV [25366]

## 2010-02-26 NOTE — Letter (Signed)
Summary: [facility]  [facility]   Imported By: Sherian Rein 01/30/2009 12:15:44  _____________________________________________________________________  External Attachment:    Type:   Image     Comment:   External Document

## 2010-02-26 NOTE — Progress Notes (Signed)
  Phone Note Call from Patient Call back at Home Phone [phone]   Caller: Patient/[phone] Call For: Newt Lukes MD Summary of Call: per Rennis Golden  pt call  need an order for labs to check for gout  Initial call taken by: Shelbie Proctor,  August 17, 2008 11:55 AM  Follow-up for Phone Call        ok for uric acid 719.47 Follow-up by: Corwin Levins MD,  August 17, 2008 1:24 PM  Additional Follow-up for Phone Call Additional follow up Details #1::        Phone Call Completed, Provider Notified pt coming in for tomorrow  nancy entered labs in the idx  Additional Follow-up by: Shelbie Proctor,  August 17, 2008 1:44 PM

## 2010-02-26 NOTE — Progress Notes (Signed)
Summary: med change  Phone Note Call from Patient Call back at Home Phone [phone]   Caller: Patient Call For: Newt Lukes MD Summary of Call: per Rennis Golden call states the medication  that Dr ellsion prescribe is hurting her stomach (arthrotec 75 mg two times a day as needed pain) Initial call taken by: Shelbie Proctor,  July 25, 2008 8:24 AM  Follow-up for Phone Call        stop arthrotec. start mobic 15mg  tab, 1/2 - 1 tab by mouth once daily, disp 30, 1 refill (will need to submit rx to pharm and notify pt). noted ortho eval not until 7/20 - we may need to try to move this up with [facility]... thx! Follow-up by: Newt Lukes MD,  July 25, 2008 8:34 AM  Additional Follow-up for Phone Call Additional follow up Details #1::        Pt [facility] Additional Follow-up by: Orlan Leavens,  July 25, 2008 10:27 AM    New/Updated Medications: MOBIC 15 MG TABS (MELOXICAM) take 1/2-1 by mouth once daily   Prescriptions: MOBIC 15 MG TABS (MELOXICAM) take 1/2-1 by mouth once daily  #30 x 1   Entered by:   Orlan Leavens   Authorized by:   Newt Lukes MD   Signed by:   Orlan Leavens on 07/25/2008   Method used:   [facility]        [facility]  [phone]* (retail)       [address] [place], [place]  [address]       Ph: [phone] or [phone]       Fax: [phone]   RxID:   [id number]

## 2010-02-26 NOTE — Assessment & Plan Note (Signed)
Summary: FLU VAC  VL  STC  Nurse Visit   Allergies: 1)  ! Penicillin 2)  ! Codeine  Orders Added: 1)  Admin 1st Vaccine [90471] 2)  Flu Vaccine 58yrs + [32951]       Flu Vaccine Consent Questions     Do you have a history of severe allergic reactions to this vaccine? no    Any prior history of allergic reactions to egg and/or gelatin? no    Do you have a sensitivity to the preservative Thimersol? no    Do you have a past history of Guillan-Barre Syndrome? no    Do you currently have an acute febrile illness? no    Have you ever had a severe reaction to latex? no    Vaccine information given and explained to patient? yes    Are you currently pregnant? no    Lot Number:[id number]   Exp Date:07/26/2009   Site Given  Left Deltoid IM Lucious Groves, CMA

## 2010-02-26 NOTE — Letter (Signed)
Summary: [facility] Consult Scheduled Letter  [facility]  [address] [place], [place] [address]   Phone: [phone]  Fax: [phone]      07/20/2008 MRN: [id number]  Patricia Ramos [facility] [address] [place], [place]  [address]    Dear Patricia Ramos,      We have scheduled an appointment for you.  At the recommendation of Dr.Ellison, we have scheduled you a consult with Dr Lestine Box PA on 08/15/08 at 8:00am.  Their phone number is [phone].  If this appointment day and time is not convenient for you, please feel free to call the office of the doctor you are being referred to at the number listed above and reschedule the appointment.     [facility]  [address] [place], [place] [address] *Above the k&w parking at [address]*    Thank you,  Patient Care Coordinator [facility]

## 2010-02-26 NOTE — Assessment & Plan Note (Signed)
Summary: r elbow pain/cd   Vital Signs:  Patient profile:   65 year old female Height:      66 inches (167.64 cm) Weight:      214.0 pounds (97.27 kg) O2 Sat:      98 % on Room air Temp:     98.3 degrees F (36.83 degrees C) oral Pulse rate:   67 / minute BP sitting:   124 / 82  (left arm) Cuff size:   regular  Vitals Entered By: Orlan Leavens RMA (August 23, 2009 8:47 AM)  O2 Flow:  Room air CC: (R) elbow pain, Heartburn, Depressive symptoms Is Patient Diabetic? No Pain Assessment Patient in pain? yes     Location: (R) elbow Type: aching Comments Also req refill on Elocon cream   Primary Care Provider:  Newt Lukes MD  CC:  (R) elbow pain, Heartburn, and Depressive symptoms.  History of Present Illness: here with 3 concerns:  Injury      This is a 65 year old woman who presents with An injury.  The problem began 2 months ago.  On a scale of 1 to 10, the intensity is described as a mild, but persistant.  Precipitated by direct trauma to elbow tip when she hit against edge of furniture.  The patient reports injury to the right elbow, but denies injury to the left elbow and right forearm, and shoulders.  The patient also reports swelling, tenderness, and numbness, initially severe, now mild.  The patient denies blood loss, weakness, loss of sensation, coolness of extremity, and loss of consciousness.  The patient denies the following risk factors for significant bleeding: aspirin use and anticoagulant use.  Screening for risk of abuse was negative.    Heartburn      The patient also presents with Heartburn.  The symptoms began >12 months ago.  The intensity is described as moderate.  symptoms are intermittent, only 1-2 episodes per month. episodes last 30 minutes per spell. Not related to activity or position. No radiation of symptoms.  The patient reports acid reflux, sour taste in mouth, burning and chest pain, but denies epigastric pain, trouble swallowing, weight loss, and  weight gain.  The patient denies the following alarm features: melena, dysphagia, hematemesis, and vomiting.  Symptoms are worse with spicy foods and NSAIDs.  Prior evaluation has included no diagnostic studies.  The patient has found the following treatments to be effective: an H2 blocker and drinking cold water or belching.  Treatment that was tried and either found to be ineffective or stopped due to problems include dietary changes and an antacid.    Depressive Symptoms      The patient also presents with Depressive symptoms.  The symptoms began 3 months ago.  The severity is described as moderate.  Believes she has had hx of same but never on medications or counseling.  The patient reports tearful spells, depressed mood, loss of interest/pleasure, and insomnia, but denies significant weight loss, significant weight gain, hypersomnia, and psychomotor agitation.  The patient also reports fatigue or loss of energy, feelings of worthlessness, and diminished concentration.  The patient denies thoughts of death, thoughts of suicide, suicidal intent, and suicidal plans.  The patient reports the following psychosocial stressors: major life changes.  Patient's past history includes family hx of depression.  The patient denies abnormally elevated mood, abnormally irritable mood, decreased need for sleep, distractibility, and flight of ideas.    Current Medications (verified): 1)  Pepcid 20 Mg Tabs (  Famotidine) .... Take 1 As Needed 2)  Elocon 0.1 % Crea (Mometasone Furoate) .... Apply As Needed Itch 3)  Ibuprofen 800 Mg Tabs (Ibuprofen) .Marland Kitchen.. 1 By Mouth Two Times A Day As Needed For Pain  Allergies (verified): 1)  ! Penicillin 2)  ! Codeine  Past History:  Past medical, surgical, family and social histories (including risk factors) reviewed, and no changes noted (except as noted below).  Past Medical History: Colonic polyps, hx of GERD obesity  MD roster: ortho Renae Fickle  Past Surgical  History: Reviewed history from 04/21/2008 and no changes required. Carpal tunnel release Tubal ligation  Family History: Reviewed history from 05/11/2008 and no changes required. mom - breast cancer age 27, HTN dad deceased - HTN, DM  Social History: Reviewed history from 05/11/2008 and no changes required. 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         also see HPI above. I have reviewed all other systems and they were negative.   Physical Exam  General:  alert, well-developed, well-nourished, and cooperative to examination.   Lungs:  normal respiratory effort, no intercostal retractions or use of accessory muscles; normal breath sounds bilaterally - no crackles and no wheezes.    Heart:  normal rate, regular rhythm, no murmur, and no rub. BLE without edema. Abdomen:  soft, non-tender, normal bowel sounds, no distention; no masses and no appreciable hepatomegaly or splenomegaly.   Msk:  small effusion over right olecranon bursa, not inflammed, warm or tender - FROM at shoulder and elbow w/o pain - neurovasc intact Neurologic:  alert & oriented X3 and cranial nerves II-XII symetrically intact.  strength normal in all extremities, sensation intact to light touch, and gait normal. speech fluent without dysarthria or aphasia; follows commands with good comprehension.  Psych:  Oriented X3, memory intact for recent and remote, normally interactive, good eye contact, not anxious appearing, mod depressed appearing, and not agitated.      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 1 by mouth once daily  Labs Reviewed: Hgb: 12.8 (05/12/2008)   Hct: 38.8 (05/12/2008)  Orders: Prescription Created Electronically [id number])  Problem # 3:  DEPRESSION (ICD-311)  "nerves" are pt primary concern, keeps her from sleeping at night - start SSRI and small dose BZ to use as needed - new rx done potential risk vs benefit of tx d/w pt who understands and agrees - offered referral to behav health for counseling - declines at this time f/u in 2-4 weeks to cont counseling and med adjustments as needed  Her updated medication list for this problem includes:    Citalopram Hydrobromide 10 Mg Tabs (Citalopram hydrobromide) .Marland Kitchen... 1 by mouth once daily x 7 days, then increase to 2 by mouth once daily    Alprazolam 0.5 Mg Tabs (Alprazolam) .Marland Kitchen... 1 by mouth every 8 hours as needed  for nerves and/or sleep  Time spent with patient 40 minutes, more than 50% of this time was spent counseling patient on depression, treatment and other medical problems related to elbow injury and untx reflux  addendum - no fx on xray - see append on xray report Orders: Prescription Created Electronically [id number])  Complete Medication List: 1)  Elocon 0.1 % Crea (Mometasone furoate) .... Apply as needed itch 2)  Ibuprofen 800 Mg Tabs (Ibuprofen) .Marland Kitchen.. 1 by mouth two times a day as needed for pain 3)  Omeprazole 20 Mg Cpdr (Omeprazole) .Marland Kitchen.. 1 by mouth once daily 4)  Citalopram Hydrobromide 10 Mg Tabs (Citalopram hydrobromide) .Marland Kitchen.. 1 by mouth once daily x 7 days, then increase to 2 by mouth once daily 5)  Voltaren 1 % Gel (Diclofenac sodium) .... Apply to right elbow pain three times a day as needed for pain symptoms 6)  Alprazolam 0.5 Mg Tabs (Alprazolam)  .Marland Kitchen.. 1 by mouth every 8 hours as needed for nerves and/or sleep  Patient Instructions: 1)  it was good to see you today. 2)  for your reflux, stop pepcid and start omeprazole every day 3)  for nerves and depression, start citalopram every day (1 once daily x 7 days, then 2 once daily). also use low dose alprazolam (xanax) as needed for nerve spells and sleep (printed prescription) 4)  for your elbow: xray today and use voltaren gel as discussed -  5)  your prescriptions have been electronically submitted to your pharmacy. Please take as directed. Contact our office if you believe you're having problems with the medication(s).  6)  Please schedule a follow-up appointment in 2-3 weeks to discuss weight loss options and folloup on these problems today, call sooner if needed.  Prescriptions: ALPRAZOLAM 0.5 MG TABS (ALPRAZOLAM) 1 by mouth every 8 hours as needed for nerves and/or sleep  #30 x 1   Entered and Authorized by:   Newt Lukes MD   Signed by:   Newt Lukes MD on 08/23/2009   Method used:   Print then Give to Patient   RxID:   [id number] VOLTAREN 1 % GEL (DICLOFENAC SODIUM) apply to right elbow pain three times a day as needed for pain symptoms  #1 x 1   Entered and Authorized by:   Newt Lukes MD   Signed by:   Newt Lukes MD on 08/23/2009   Method used:   Electronically to        [facility]  [phone]* (retail)       [address] [place], [place]  [address]       Ph: [phone] or [phone]       Fax: [phone]   RxID:   [id number] CITALOPRAM HYDROBROMIDE 10 MG TABS (CITALOPRAM HYDROBROMIDE) 1 by mouth once daily x 7 days, then increase to 2 by mouth once daily  #60 x 1   Entered and Authorized by:   Newt Lukes MD   Signed by:   Newt Lukes MD on 08/23/2009   Method used:   Electronically to        [facility]  [phone]* (retail)       [address] [place], [place]  [address]       Ph:  [phone] or [phone]       Fax: [phone]   RxID:   [id number] OMEPRAZOLE 20 MG CPDR (OMEPRAZOLE) 1 by  mouth once daily  #30 x 3   Entered and Authorized by:   Newt Lukes MD   Signed by:   Newt Lukes MD on 08/23/2009   Method used:   Electronically to        [facility]  [phone]* (retail)       [address] [place], [place]  [address]       Ph: [phone] or [phone]       Fax: [phone]   RxID:   [id number] ELOCON 0.1 % CREA (MOMETASONE FUROATE) apply as needed itch  #15 g x 3   Entered by:   Orlan Leavens RMA   Authorized by:   Newt Lukes MD   Signed by:   Orlan Leavens RMA on 08/23/2009   Method used:   Electronically to        [facility]  [phone]* (retail)       [address] [place], [place]  [address]       Ph: [phone] or [phone]       Fax: [phone]   RxID:   [id number]

## 2010-03-25 ENCOUNTER — Telehealth: Payer: Self-pay | Admitting: Internal Medicine

## 2010-04-03 ENCOUNTER — Telehealth: Payer: Self-pay | Admitting: Internal Medicine

## 2010-04-04 NOTE — Progress Notes (Signed)
Summary: PHENTERMINE  Phone Note Refill Request Message from:  Fax from Pharmacy on March 25, 2010 4:08 PM  Refills Requested: Medication #1:  Phentermine 37.5mg  take 1 by mouth every day   Last Refilled: 10/02/2009  Method Requested: Fax to Local Pharmacy Initial call taken by: Orlan Leavens RMA,  March 25, 2010 4:09 PM  Follow-up for Phone Call        Faxed paper request back to [facility] DENIED. NEED OFFICE VISIT FOR APPROVAL Follow-up by: Orlan Leavens RMA,  March 25, 2010 4:10 PM

## 2010-04-08 ENCOUNTER — Encounter: Payer: Self-pay | Admitting: Internal Medicine

## 2010-04-08 LAB — HM MAMMOGRAPHY

## 2010-04-09 NOTE — Progress Notes (Signed)
Summary: alprazolam & Ibuprofen  Phone Note Refill Request Message from:  Fax from Pharmacy on April 03, 2010 3:34 PM  Refills Requested: Medication #1:  IBUPROFEN 800 MG TABS 1 by mouth two times a day as needed for pain   Last Refilled: 10/15/2009  Medication #2:  ALPRAZOLAM 0.25 MG TABS 1/2-1 by mouth every 12h as needed for nerves.   Last Refilled: [facility]/29/2009 [facility] Last ov 11/08/09 Is this ok to refill?   Method Requested: Fax to Local Pharmacy Next Appointment Scheduled: None Initial call taken by: Orlan Leavens RMA,  April 03, 2010 3:35 PM  Follow-up for Phone Call        yes - ok to fill as requested Follow-up by: Newt Lukes MD,  April 03, 2010 5:43 PM  Additional Follow-up for Phone Call Additional follow up Details #1::        [facility] Updated EMR Additional Follow-up by: Orlan Leavens RMA,  April 04, 2010 8:29 AM    Prescriptions: ALPRAZOLAM 0.25 MG TABS (ALPRAZOLAM) 1/2-1 by mouth every 12h as needed for nerves  #30 x 0   Entered by:   Orlan Leavens RMA   Authorized by:   Newt Lukes MD   Signed by:   Orlan Leavens RMA on 04/04/2010   Method used:   Historical   RxID:   [id number] IBUPROFEN 800 MG TABS (IBUPROFEN) 1 by mouth two times a day as needed for pain  #60 Tablet x 0   Entered by:   Orlan Leavens RMA   Authorized by:   Newt Lukes MD   Signed by:   Orlan Leavens RMA on 04/04/2010   Method used:   Historical   RxID:   [id number]

## 2010-05-29 ENCOUNTER — Other Ambulatory Visit: Payer: Self-pay | Admitting: *Deleted

## 2010-05-29 NOTE — Telephone Encounter (Signed)
Faxed paper request back to [facility] rx denied. Need office vist for approval...05/29/10@11 :35am/LMB

## 2010-06-10 ENCOUNTER — Encounter: Payer: Self-pay | Admitting: Internal Medicine

## 2010-06-13 ENCOUNTER — Encounter: Payer: Self-pay | Admitting: Internal Medicine

## 2010-07-24 ENCOUNTER — Encounter: Payer: Self-pay | Admitting: Internal Medicine

## 2010-12-25 ENCOUNTER — Encounter: Payer: Self-pay | Admitting: Internal Medicine

## 2010-12-25 ENCOUNTER — Ambulatory Visit (INDEPENDENT_AMBULATORY_CARE_PROVIDER_SITE_OTHER): Payer: 59 | Admitting: Internal Medicine

## 2010-12-25 VITALS — BP 124/82 | HR 79 | Temp 98.6°F

## 2010-12-25 DIAGNOSIS — F4321 Adjustment disorder with depressed mood: Secondary | ICD-10-CM

## 2010-12-25 DIAGNOSIS — F329 Major depressive disorder, single episode, unspecified: Secondary | ICD-10-CM

## 2010-12-25 DIAGNOSIS — E669 Obesity, unspecified: Secondary | ICD-10-CM

## 2010-12-25 MED ORDER — PHENTERMINE HCL 37.5 MG PO CAPS: 37.5000 mg | ORAL_CAPSULE | ORAL | Status: DC

## 2010-12-25 MED ORDER — MOMETASONE FUROATE 0.1 % EX CREA: TOPICAL_CREAM | CUTANEOUS | Status: DC | PRN

## 2010-12-25 MED ORDER — DIAZEPAM 5 MG PO TABS: 5.0000 mg | ORAL_TABLET | Freq: Every evening | ORAL | Status: DC | PRN

## 2010-12-25 NOTE — Patient Instructions (Signed)
It was good to see you today. I am so sorry to hear about your loss Valium prescription provided today for sleep- one half to whole tablet at bedtime as needed Also phentermine to help protect against weight gain as requested, please watch for symptoms of anxiety while using this medication Refill on cream as requested we'll make referral to counseling to help support you. Our office will contact you regarding appointment(s) once made. Please schedule followup in 6-8 weeks, call sooner if problems.

## 2010-12-25 NOTE — Progress Notes (Signed)
  Subjective:    Patient ID: Patricia Ramos, female    DOB: 28-Jun-1945, 65 y.o.   MRN: [id number]  HPI Complains of insomnia Precipitated by unexpected death of daughter 2010-12-18 Manifest with insomnia, binge eating and weight gain, nausea, and fatigue History of depression-remains on citalopram SSRI to treat same Given valium by friend - helps sleep - request rx for same Agreeable to counseling refer  Past Medical History  Diagnosis Date  . OBESITY   . DEPRESSION   . GERD   . COLONIC POLYPS, HX OF   . Anxiety     Review of Systems  Constitutional: Negative for fever and chills.  Respiratory: Negative for cough and shortness of breath.   Cardiovascular: Negative for chest pain and palpitations.  Psychiatric/Behavioral: Positive for sleep disturbance, dysphoric mood and decreased concentration. Negative for confusion and self-injury. The patient is not hyperactive.        Objective:   Physical Exam BP 124/82  Pulse 79  Temp(Src) 98.6 F (37 C) (Oral)  SpO2 96% Wt Readings from Last 3 Encounters:  11/08/09 203 lb 9.6 oz (92.352 kg)  09/06/09 214 lb (97.07 kg)  08/23/09 214 lb (97.07 kg)   Constitutional: She appears fatigued - but well-developed and well-nourished. No distress.  Cardiovascular: Normal rate, regular rhythm and normal heart sounds.  No murmur heard. No BLE edema. Pulmonary/Chest: Effort normal and breath sounds normal. No respiratory distress. She has no wheezes. Psychiatric: She has an appropriately sad mood and affect. Her behavior is normal. Judgment and thought content normal.   Lab Results  Component Value Date   WBC 5.4 05/12/2008   HGB 12.8 05/12/2008   HCT 38.8 05/12/2008   PLT 333.0 05/12/2008   GLUCOSE 98 05/12/2008   CHOL 189 05/12/2008   TRIG 38.0 05/12/2008   HDL 52.20 05/12/2008   LDLCALC 129* 05/12/2008   ALT 11 05/12/2008   AST 19 05/12/2008   NA 145 05/12/2008   K 4.5 05/12/2008   CL 109 05/12/2008   CREATININE 0.6 05/12/2008   BUN 15  05/12/2008   CO2 31 05/12/2008   TSH 0.76 05/12/2008       Assessment & Plan:  Grief reaction - precipitated by unexpected death of daughter 12-18-10  valium rx done as requested for sleep - also refer to behavioral health - verified no si/hi - support offered Followup 6 weeks here to review same  Depression -continue citalopram as ongoing chronically- appropriate exacerbation with grief  Obesity - request refill on phentermine to help counter weight gain - filled 30day supply x 1 last year with >40# weight loss - lowest 179, now 184 -reviewed possibility of exacerbating anxiety and panic symptoms using stimulant and need for control of binge eating has response to grief and depression. Patient to talk with behavioral health about same. See grief discussion above

## 2011-08-08 ENCOUNTER — Encounter: Payer: Self-pay | Admitting: Internal Medicine

## 2011-08-08 ENCOUNTER — Ambulatory Visit (INDEPENDENT_AMBULATORY_CARE_PROVIDER_SITE_OTHER): Payer: 59 | Admitting: Internal Medicine

## 2011-08-08 VITALS — BP 118/82 | HR 79 | Temp 97.9°F | Ht 66.0 in | Wt 192.6 lb

## 2011-08-08 DIAGNOSIS — M79601 Pain in right arm: Secondary | ICD-10-CM

## 2011-08-08 DIAGNOSIS — S61209A Unspecified open wound of unspecified finger without damage to nail, initial encounter: Secondary | ICD-10-CM

## 2011-08-08 DIAGNOSIS — M7989 Other specified soft tissue disorders: Secondary | ICD-10-CM

## 2011-08-08 DIAGNOSIS — M79609 Pain in unspecified limb: Secondary | ICD-10-CM

## 2011-08-08 DIAGNOSIS — E669 Obesity, unspecified: Secondary | ICD-10-CM

## 2011-08-08 DIAGNOSIS — S61210A Laceration without foreign body of right index finger without damage to nail, initial encounter: Secondary | ICD-10-CM

## 2011-08-08 MED ORDER — HYDROCODONE-ACETAMINOPHEN 5-500 MG PO TABS: 1.0000 | ORAL_TABLET | Freq: Three times a day (TID) | ORAL | Status: AC | PRN

## 2011-08-08 MED ORDER — AMITRIPTYLINE HCL 10 MG PO TABS: 10.0000 mg | ORAL_TABLET | Freq: Every day | ORAL | Status: DC

## 2011-08-08 MED ORDER — KETOROLAC TROMETHAMINE 60 MG/2ML IM SOLN
60.0000 mg | Freq: Once | INTRAMUSCULAR | Status: AC
  Administered 2011-08-08: 60 mg via INTRAMUSCULAR

## 2011-08-08 MED ORDER — MOMETASONE FUROATE 0.1 % EX CREA: TOPICAL_CREAM | CUTANEOUS | Status: DC | PRN

## 2011-08-08 NOTE — Assessment & Plan Note (Signed)
Wt Readings from Last 3 Encounters:  08/08/11 192 lb 9.6 oz (87.363 kg)  11/08/09 203 lb 9.6 oz (92.352 kg)  09/06/09 214 lb (97.07 kg)   Decline refill on phentermine pending cpx labs and resolution of acute pain issues

## 2011-08-08 NOTE — Progress Notes (Signed)
Subjective:    Patient ID: Patricia Ramos, female    DOB: 1945/07/15, 66 y.o.   MRN: [id number]  HPI  complains of pain r hand and arm symptoms began 6 weeks ago Precipitated by overuse, painting Describes as numbness and ache in [place], worse at night Then complicated by accidental self induced laceration to index fingertip 2-3 weeks ago tx would with bid soaks, covering with neosporin ointment and bandage Increase throbbing and numbness pain in right hand radiating into r forearm, worse qhs Now swelling of right hand overnight,  Feels weak right hand grip Pain not relieved with nsaids or tylenol  Also requests refill on cream and weight loss pills  Past Medical History  Diagnosis Date  . OBESITY     Starting weight 220 lb in 2011, then 40lb intentional weight loss, lowest 179  . DEPRESSION   . GERD   . COLONIC POLYPS, HX OF   . Anxiety      Review of Systems  HENT: Negative for neck pain.   Musculoskeletal: Negative for gait problem.  Skin: Positive for wound. Negative for color change, pallor and rash.  Neurological: Positive for weakness and numbness. Negative for facial asymmetry and headaches.       Objective:   Physical Exam BP 118/82  Pulse 79  Temp 97.9 F (36.6 C) (Oral)  Ht 5\' 6"  (1.676 m)  Wt 192 lb 9.6 oz (87.363 kg)  BMI 31.09 kg/m2  SpO2 98% Wt Readings from Last 3 Encounters:  08/08/11 192 lb 9.6 oz (87.363 kg)  11/08/09 203 lb 9.6 oz (92.352 kg)  09/06/09 214 lb (97.07 kg)   Constitutional: She appears well-developed and well-nourished. Nontoxic but uncomfortable.  Musculoskeletal:R hand/wrist with normal range of motion, no joint effusions. No gross deformities. right Shoulder: Full range of motion. Neurovascularly intact distally. Good strength with stress of rotator cuff but causes pain. Positive impingement signs. 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 No symptoms infection. Ligament fx intact to flexion/extention but painful to palpation. Other skin is warm and dry. No rash noted. No erythema.  Psychiatric: She has a normal mood and affect. Her behavior is normal. Judgment and thought content normal.     Lab Results  Component Value Date   WBC 5.4 05/12/2008   HGB 12.8 05/12/2008   HCT 38.8 05/12/2008   PLT 333.0 05/12/2008   GLUCOSE 98 05/12/2008   CHOL 189 05/12/2008   TRIG 38.0 05/12/2008   HDL 52.20 05/12/2008   LDLCALC 129* 05/12/2008   ALT 11 05/12/2008   AST 19 05/12/2008   NA 145 05/12/2008   K 4.5 05/12/2008   CL 109 05/12/2008   CREATININE 0.6 05/12/2008   BUN 15 05/12/2008   CO2 31 05/12/2008   TSH 0.76 05/12/2008        Assessment & Plan:  R index finger laceration - 3 weeks ago - no infection on exam - Treat pain with IM toradol 60mg  now -  Start vicodin as needed and use elavil qhs Refer to hand specialist given ?severe CTS or ?RLS like with intense pain and swelling qhs - suspect NCS needed but pt prefers to start with hand eval - defer dx testing to speciailst  Continue soaking, neosporin and bandaid cover bid until further eval  RUE pain - ?severe cts vs impingement syndrome, related to overuse. recommended nsaids pending hand eval  as above

## 2011-08-08 NOTE — Patient Instructions (Signed)
It was good to see you today. we'll make referral to hand specialist for your swelling and pain symptoms. Our office will contact you regarding appointment(s) once made. Toradol shot given for pain today in office Use Elavil every night at bedtime for nerve pain and vicodin as needed for other pain - Your prescription(s) have been submitted to your pharmacy. Please take as directed and contact our office if you believe you are having problem(s) with the medication(s). Please schedule followup for physical and labs, call sooner if problems.

## 2011-10-06 ENCOUNTER — Telehealth: Payer: Self-pay | Admitting: *Deleted

## 2011-10-06 DIAGNOSIS — Z0000 Encounter for general adult medical examination without abnormal findings: Secondary | ICD-10-CM

## 2011-10-06 NOTE — Telephone Encounter (Signed)
Message copied by Deatra James on Mon Oct 06, 2011  3:21 PM ------      Message from: Burnett Harry      Created: Mon Oct 06, 2011  2:43 PM      Regarding: CPE 12/04/2011       [facility]

## 2011-10-06 NOTE — Telephone Encounter (Signed)
Lm on vm to inform pt

## 2011-10-06 NOTE — Telephone Encounter (Signed)
Patient has [employer] doesn't approve of labs to be done prior she has to see md first.../lmb

## 2011-10-20 ENCOUNTER — Encounter: Payer: Self-pay | Admitting: Internal Medicine

## 2011-11-19 ENCOUNTER — Encounter: Payer: Self-pay | Admitting: Internal Medicine

## 2011-11-19 ENCOUNTER — Ambulatory Visit (INDEPENDENT_AMBULATORY_CARE_PROVIDER_SITE_OTHER): Payer: 59 | Admitting: Internal Medicine

## 2011-11-19 VITALS — BP 122/92 | HR 65 | Temp 97.7°F | Ht 66.0 in | Wt 192.0 lb

## 2011-11-19 DIAGNOSIS — G5700 Lesion of sciatic nerve, unspecified lower limb: Secondary | ICD-10-CM

## 2011-11-19 DIAGNOSIS — E669 Obesity, unspecified: Secondary | ICD-10-CM

## 2011-11-19 MED ORDER — PHENTERMINE HCL 37.5 MG PO CAPS: 37.5000 mg | ORAL_CAPSULE | ORAL | Status: DC

## 2011-11-19 MED ORDER — IBUPROFEN 800 MG PO TABS: 800.0000 mg | ORAL_TABLET | Freq: Three times a day (TID) | ORAL | Status: DC | PRN

## 2011-11-19 MED ORDER — KETOROLAC TROMETHAMINE 30 MG/ML IJ SOLN
30.0000 mg | Freq: Once | INTRAMUSCULAR | Status: AC
  Administered 2011-11-19: 30 mg via INTRAMUSCULAR

## 2011-11-19 MED ORDER — PREDNISONE (PAK) 10 MG PO TABS: 10.0000 mg | ORAL_TABLET | ORAL | Status: DC

## 2011-11-19 NOTE — Progress Notes (Signed)
  Subjective:    Patient ID: Patricia Ramos, female    DOB: Nov 16, 1945, 66 y.o.   MRN: [id number]  HPI complains of left posterior thigh pain Onset 2 weeks ago, gradually and progressively worse since onset Began intense walking program 4 weeks ago Initially associated with mild back pain which has resolved with ibuprofen use thigh pain not improved with ibuprofen, Vicodin Pain exacerbated by activity, especially climbing stairs Pain improved with rest and lying supine  Past Medical History  Diagnosis Date  . OBESITY     Starting weight 220 lb in 2011, then 40lb intentional weight loss, lowest 179  . DEPRESSION   . GERD   . COLONIC POLYPS, HX OF   . Anxiety     Review of Systems  Constitutional: Negative for fever, fatigue and unexpected weight change.  Respiratory: Negative for cough and shortness of breath.   Cardiovascular: Negative for chest pain, palpitations and leg swelling.  Musculoskeletal: Positive for gait problem. Negative for back pain, joint swelling and arthralgias.       Objective:   Physical Exam BP 122/92  Pulse 65  Temp 97.7 F (36.5 C) (Oral)  Ht 5\' 6"  (1.676 m)  Wt 192 lb (87.091 kg)  BMI 30.99 kg/m2  SpO2 99% Wt Readings from Last 3 Encounters:  11/19/11 192 lb (87.091 kg)  08/08/11 192 lb 9.6 oz (87.363 kg)  11/08/09 203 lb 9.6 oz (92.352 kg)   Constitutional: She os overweight, but appears well-developed and well-nourished. No distress.  Neck: Normal range of motion. Neck supple. No JVD present. No thyromegaly present.  Cardiovascular: Normal rate, regular rhythm and normal heart sounds.  No murmur heard. No BLE edema. Pulmonary/Chest: Effort normal and breath sounds normal. No respiratory distress. She has no wheezes.  Musculoskeletal: L side: tender to palpation along ischial tuberosity and piriformis insertion. FROM hip and lumbar spine. Good strength B hip extensors and flexors but pain with external stretch of piriformis. Neurological: She  is alert and oriented to person, place, and time. No cranial nerve deficit. Coordination normal.  Psychiatric: She has a normal mood and affect. Her behavior is normal. Judgment and thought content normal.   Lab Results  Component Value Date   WBC 5.4 05/12/2008   HGB 12.8 05/12/2008   HCT 38.8 05/12/2008   PLT 333.0 05/12/2008   GLUCOSE 98 05/12/2008   CHOL 189 05/12/2008   TRIG 38.0 05/12/2008   HDL 52.20 05/12/2008   LDLCALC 129* 05/12/2008   ALT 11 05/12/2008   AST 19 05/12/2008   NA 145 05/12/2008   K 4.5 05/12/2008   CL 109 05/12/2008   CREATININE 0.6 05/12/2008   BUN 15 05/12/2008   CO2 31 05/12/2008   TSH 0.76 05/12/2008       Assessment & Plan:  Pyriformis syndrome, L side - exac by overuse with new exercise program Education and reassurance provided Patient information on appropriate stretches also provided Prednisone taper for inflammation given insufficient response to over-the-counter NSAIDs Patient to followup in 2 weeks if unimproved, sooner if worse

## 2011-11-19 NOTE — Assessment & Plan Note (Signed)
Wt Readings from Last 3 Encounters:  11/19/11 192 lb (87.091 kg)  08/08/11 192 lb 9.6 oz (87.363 kg)  11/08/09 203 lb 9.6 oz (92.352 kg)   Ok for phentermine refill pending cpx 11/2011 The patient is asked to make an attempt to improve diet and exercise patterns to aid in medical management of this problem.

## 2011-11-19 NOTE — Patient Instructions (Addendum)
It was good to see you today. 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 Please take as directed and contact our office if you believe you are having problem(s) with the medication(s). Followup in 2 weeks if pain symptoms unimproved, sooner if worse Keep followup next month for scheduled physical and labs Piriformis Syndrome with Rehab Piriformis syndrome is a condition the affects the nervous system in the area of the hip, and is characterized by pain and possibly a loss of feeling in the backside (posterior) thigh that may extend down the entire length of the leg. The symptoms are caused by an increase in pressure on the sciatic nerve by the piriformis muscle, which is on the back of the hip and is responsible for externally rotating the hip. The sciatic nerve and its branches connect to much of the leg. Normally the sciatic nerve runs between the piriformis muscle and other muscles. However, in certain individuals the nerve runs through the muscle, which causes an increase in pressure on the nerve and results in the symptoms of piriformis syndrome. SYMPTOMS    Pain, tingling, numbness, or burning in the back of the thigh that may also extend down the entire leg.   Occasionally, tenderness in the buttock.   Loss of function of the leg.   Pain that worsens when using the piriformis muscle (running, jumping, or stairs).   Pain that increases with prolonged sitting.   Pain that is lessened by laying flat on the back.  CAUSES    Piriformis syndrome is the result of an increase in pressure placed on the sciatic nerve. Often times  piriformis syndrome is an overuse injury.   Stress placed on the nerve from a sudden increase in the intensity, frequency, or duration of training.   Compensation of other extremity injuries.  RISK INCREASES WITH:  Sports that involve the piriformis muscle (running, walking or jumping).   You are born with (congenital) a defect in which the sciatic nerve passes through the muscle.  PREVENTION  Warm up and stretch properly before activity.   Allow for adequate recovery between workouts.   Maintain physical fitness:   Strength, flexibility, and endurance.   Cardiovascular fitness.  PROGNOSIS   If treated properly, then the symptoms of piriformis syndrome usually resolve in 2 to 6 weeks. RELATED COMPLICATIONS    Persistent and possibly permanent pain and numbness in the lower extremity.   Weakness of the extremity that may progress to disability and inability to compete.  TREATMENT   The most effective treatment for piriformis syndrome is rest from any activities that aggravate the symptoms. Ice and pain medication may help reduce pain and inflammation. The use of strengthening and stretching exercises may help reduce pain with activity. These exercises may be performed at home or with a therapist. A referral to a therapist may be given for further evaluation and treatment, such as ultrasound. Corticosteroid injections may be given to reduce inflammation that is causing pressure to be placed on the sciatic nerve. If non-surgical (conservative) treatment is unsuccessful, then surgery may be recommended.   MEDICATION    If pain medication is necessary, then nonsteroidal anti-inflammatory  medications, such as aspirin and ibuprofen, or other minor pain relievers, such as acetaminophen, are often recommended.   Do not take pain medication for 7 days before surgery.   Prescription pain relievers may be given if deemed necessary by your caregiver. Use only as directed and only as much as  you need.   Corticosteroid injections may be given by your caregiver. These injections should be reserved for the most serious cases, because they may only be given a certain number of times.  HEAT AND COLD:    Cold treatment (icing) relieves pain and reduces inflammation. Cold treatment should be applied for 10 to 15 minutes every 2 to 3 hours for inflammation and pain and immediately after any activity that aggravates your symptoms. Use ice packs or massage the area with a piece of ice (ice massage).   Heat treatment may be used prior to performing the stretching and strengthening activities prescribed by your caregiver, physical therapist, or athletic trainer. Use a heat pack or soak the injury in warm water.  SEEK IMMEDIATE MEDICAL CARE IF:  Treatment seems to offer no benefit, or the condition worsens.   Any medications produce adverse side effects.  EXERCISES RANGE OF MOTION (ROM) AND STRETCHING EXERCISES - Piriformis Syndrome These exercises may help you when beginning to rehabilitate your injury. Your symptoms may resolve with or without further involvement from your physician, physical therapist or athletic trainer. While completing these exercises, remember:    Restoring tissue flexibility helps normal motion to return to the joints. This allows healthier, less painful movement and activity.   An effective stretch should be held for at least 30 seconds.   A stretch should never be painful. You should only feel a gentle lengthening or release in the stretched tissue.  STRETCH - Hip Rotators  Lie on your back on a firm surface. Grasp your right / left knee with your right / left hand and your ankle with your opposite hand.   Keeping your hips and shoulders firmly planted, gently pull your right / left knee and rotate your lower leg toward your opposite shoulder until you feel a stretch in your buttocks.   Hold this stretch for __________ seconds.  Repeat this stretch __________  times. Complete this stretch __________ times per day. STRETCH  Iliotibial Band  On the floor or bed, lie on your side so your right / left leg is on top. Bend your knee and grab your ankle.   Slowly bring your knee back so that your thigh is in line with your trunk. Keep your heel at your buttocks and gently arch your back so your head, shoulders and hips line up.   Slowly lower your leg so that your knee approaches the floor/bed until you feel a gentle stretch on the outside of your right / left thigh. If you do not feel a stretch and your knee will not fall farther, place the heel of your opposite foot on top of your knee and pull your thigh down farther.   Hold this stretch for __________ seconds.  Repeat __________ times. Complete __________ times per day. STRENGTHENING EXERCISES - Piriformis Syndrome  These are some of the caregiver again or until your symptoms are resolved. Remember:    Strong muscles with good endurance tolerate stress better.   Do the exercises as initially prescribed by your caregiver. Progress slowly with each exercise, gradually increasing the number of repetitions and weight used under their guidance.  STRENGTH - Hip Abductors, Straight  Leg Raises Be aware of your form throughout the entire exercise so that you exercise the correct muscles. Sloppy form means that you are not strengthening the correct muscles.  Lie on your side so that your head, shoulders, knee and hip line up. You may bend your lower knee to help maintain your balance. Your right / left leg should be on top.   Roll your hips slightly forward, so that your hips are stacked directly over each other and your right / left knee is facing forward.   Lift your top leg up 4-6 inches, leading with your heel. Be sure that your foot does not drift forward or that your knee does not roll toward the ceiling.   Hold this position for __________ seconds. You should feel the muscles in your outer hip lifting  (you may not notice this until your leg begins to tire).   Slowly lower your leg to the starting position. Allow the muscles to fully relax before beginning the next repetition.  Repeat __________ times. Complete this exercise __________ times per day.   STRENGTH - Hip Abductors, Quadriped  On a firm, lightly padded surface, position yourself on your hands and knees. Your hands should be directly below your shoulders and your knees should be directly below your hips.   Keeping your right / left knee bent, lift your leg out to the side. Keep your legs level and in line with your shoulders.   Position yourself on your hands and knees.   Hold for __________ seconds.   Keeping your trunk steady and your hips level, slowly lower your leg to the starting position.  Repeat __________ times. Complete this exercise __________ times per day.   STRENGTH - Hip Abductors, Standing  Tie one end of a rubber exercise band/tubing to a secure surface (table, pole) and tie a loop at the other end.   Place the loop around your right / left ankle. Keeping your ankle with the band directly opposite of the secured end, step away until there is tension in the tube/band.   Hold onto a chair as needed for balance.   Keeping your back upright, your shoulders over your hips, and your toes pointing forward, lift your right / left leg out to your side. Be sure to lift your leg with your hip muscles. Do not "throw" your leg or tip your body to lift your leg.   Slowly and with control, return to the starting position.  Repeat exercise __________ times. Complete this exercise __________ times per day.   Document Released: 01/13/2005 Document Revised: 07/15/2011 Document Reviewed: 04/27/2008 [facility] Patient Information 2013 [place], [place].

## 2011-11-21 ENCOUNTER — Telehealth: Payer: Self-pay

## 2011-11-21 MED ORDER — DEXAMETHASONE 1 MG PO TABS: ORAL_TABLET | ORAL | Status: DC

## 2011-11-21 NOTE — Telephone Encounter (Signed)
Pt advised of med change.  

## 2011-11-21 NOTE — Telephone Encounter (Signed)
Stop prednisone Use Decadron taper instead, erx done

## 2011-11-21 NOTE — Telephone Encounter (Signed)
Pt called stating that prednisone prescribed at OV 10/24 is causing headache. Pt is requesting alternative medication, please advise.

## 2011-11-27 ENCOUNTER — Ambulatory Visit (INDEPENDENT_AMBULATORY_CARE_PROVIDER_SITE_OTHER): Payer: 59 | Admitting: Internal Medicine

## 2011-11-27 ENCOUNTER — Ambulatory Visit (INDEPENDENT_AMBULATORY_CARE_PROVIDER_SITE_OTHER)
Admission: RE | Admit: 2011-11-27 | Discharge: 2011-11-27 | Disposition: A | Discharge status: Home / Self Care | Payer: 59 | Source: Ambulatory Visit | Attending: Internal Medicine | Admitting: Internal Medicine

## 2011-11-27 ENCOUNTER — Encounter: Payer: Self-pay | Admitting: Internal Medicine

## 2011-11-27 VITALS — BP 112/70 | HR 79 | Temp 98.8°F | Ht 66.0 in

## 2011-11-27 DIAGNOSIS — M79609 Pain in unspecified limb: Secondary | ICD-10-CM

## 2011-11-27 DIAGNOSIS — M5416 Radiculopathy, lumbar region: Secondary | ICD-10-CM

## 2011-11-27 DIAGNOSIS — M79605 Pain in left leg: Secondary | ICD-10-CM

## 2011-11-27 DIAGNOSIS — IMO0002 Reserved for concepts with insufficient information to code with codable children: Secondary | ICD-10-CM

## 2011-11-27 MED ORDER — OXYCODONE-ACETAMINOPHEN 10-325 MG PO TABS: 1.0000 | ORAL_TABLET | Freq: Three times a day (TID) | ORAL | Status: DC | PRN

## 2011-11-27 MED ORDER — KETOROLAC TROMETHAMINE 60 MG/2ML IM SOLN
60.0000 mg | Freq: Once | INTRAMUSCULAR | Status: AC
  Administered 2011-11-27: 60 mg via INTRAMUSCULAR

## 2011-11-27 NOTE — Progress Notes (Signed)
Subjective:    Patient ID: Patricia Ramos, female    DOB: 28-Jul-1945, 66 y.o.   MRN: [id number]  Leg Pain  The incident occurred more than 1 week ago. There was no injury mechanism. The pain is present in the left leg, left thigh and left hip. The quality of the pain is described as aching, burning and shooting. The pain is at a severity of 8/10. The pain has been constant since onset. Pertinent negatives include no inability to bear weight, loss of motion, loss of sensation or muscle weakness. She reports no foreign bodies present. The symptoms are aggravated by movement. She has tried NSAIDs for the symptoms. The treatment provided mild relief.   complains of left posterior thigh pain Onset 3 weeks ago, gradually and progressively worse since onset Began intense walking program 5 weeks ago Initially associated with mild back pain which has resolved with ibuprofen use thigh pain not improved with ibuprofen, Vicodin, or pred pak Pain exacerbated by activity, especially climbing stairs or walking on incline Pain improved with rest and lying supine  Past Medical History  Diagnosis Date  . OBESITY     Starting weight 220 lb in 2011, then 40lb intentional weight loss, lowest 179  . DEPRESSION   . GERD   . COLONIC POLYPS, HX OF   . Anxiety     Review of Systems  Constitutional: Negative for fever, fatigue and unexpected weight change.  Respiratory: Negative for cough and shortness of breath.   Cardiovascular: Negative for chest pain, palpitations and leg swelling.  Musculoskeletal: Positive for gait problem. Negative for back pain, joint swelling and arthralgias.       Objective:   Physical Exam  BP 112/70  Pulse 79  Temp 98.8 F (37.1 C) (Oral)  Ht 5\' 6"  (1.676 m)  SpO2 96% Wt Readings from Last 3 Encounters:  11/19/11 192 lb (87.091 kg)  08/08/11 192 lb 9.6 oz (87.363 kg)  11/08/09 203 lb 9.6 oz (92.352 kg)   Constitutional: She is overweight, but appears well-developed and  well-nourished. No distress.  Cardiovascular: Normal rate, regular rhythm and normal heart sounds.  No murmur heard. No BLE edema. Pulmonary/Chest: Effort normal and breath sounds normal. No respiratory distress. She has no wheezes.  Musculoskeletal: L side: tender to palpation along ischial tuberosity and piriformis insertion. FROM hip nontender palpation of groin. Good strength B hip extensors and flexors. Back: full range of motion of thoracic and lumbar spine. Non tender to palpation over the vertebra. Negative straight leg raise. DTR's are symmetrically intact. Sensation intact in all dermatomes of the lower extremities. Full strength to manual muscle testing. patient is able to heel toe walk with difficulty and ambulates with antalgic gait. Neurological: She is alert and oriented to person, place, and time. No cranial nerve deficit. Coordination normal.  Psychiatric: She has an anxious mood and affect. Her behavior is normal. Judgment and thought content normal.   Lab Results  Component Value Date   WBC 5.4 05/12/2008   HGB 12.8 05/12/2008   HCT 38.8 05/12/2008   PLT 333.0 05/12/2008   GLUCOSE 98 05/12/2008   CHOL 189 05/12/2008   TRIG 38.0 05/12/2008   HDL 52.20 05/12/2008   LDLCALC 129* 05/12/2008   ALT 11 05/12/2008   AST 19 05/12/2008   NA 145 05/12/2008   K 4.5 05/12/2008   CL 109 05/12/2008   CREATININE 0.6 05/12/2008   BUN 15 05/12/2008   CO2 31 05/12/2008   TSH 0.76 05/12/2008  Assessment & Plan:  LLE pain - ?lumbar radiculopathy vs Pyriformis syndrome exac by overuse with new exercise program  Unimproved with steroid taper x 6d for inflammation, OTC NSAIDs and hydrocodone Check lumbar spine plain film and left hip now at patient request Toradol 60mg  injection in office today for pain relief at patient request Prescription for Percocet and refer to ortho spine specialist

## 2011-11-27 NOTE — Patient Instructions (Signed)
It was good to see you today. High-dose Toradol shot given for pain in office today Test(s) ordered today. Your results will be released to MyChart (or called to you) after review, usually within 72hours after test completion. If any changes need to be made, you will be notified at that same time. Plan referral to back specialist depending on your x-rays to help evaluate and treat your leg pain Continue the ibuprofen and use Percocet as needed for pain until further specialist evaluation

## 2011-12-04 ENCOUNTER — Ambulatory Visit (INDEPENDENT_AMBULATORY_CARE_PROVIDER_SITE_OTHER): Payer: 59 | Admitting: Internal Medicine

## 2011-12-04 ENCOUNTER — Other Ambulatory Visit (INDEPENDENT_AMBULATORY_CARE_PROVIDER_SITE_OTHER): Payer: 59

## 2011-12-04 ENCOUNTER — Encounter: Payer: Self-pay | Admitting: Internal Medicine

## 2011-12-04 VITALS — BP 118/80 | HR 64 | Temp 97.8°F | Ht 66.0 in | Wt 198.0 lb

## 2011-12-04 DIAGNOSIS — Z0000 Encounter for general adult medical examination without abnormal findings: Secondary | ICD-10-CM

## 2011-12-04 DIAGNOSIS — M5416 Radiculopathy, lumbar region: Secondary | ICD-10-CM

## 2011-12-04 DIAGNOSIS — F329 Major depressive disorder, single episode, unspecified: Secondary | ICD-10-CM

## 2011-12-04 DIAGNOSIS — Z79899 Other long term (current) drug therapy: Secondary | ICD-10-CM

## 2011-12-04 DIAGNOSIS — IMO0002 Reserved for concepts with insufficient information to code with codable children: Secondary | ICD-10-CM

## 2011-12-04 LAB — URINALYSIS, ROUTINE W REFLEX MICROSCOPIC
Bilirubin Urine: NEGATIVE
Hgb urine dipstick: NEGATIVE
Ketones, ur: NEGATIVE
Leukocytes, UA: NEGATIVE
Nitrite: NEGATIVE
Specific Gravity, Urine: 1.025 (ref 1.000–1.030)
Total Protein, Urine: NEGATIVE
Urine Glucose: NEGATIVE
Urobilinogen, UA: 0.2 (ref 0.0–1.0)
pH: 6 (ref 5.0–8.0)

## 2011-12-04 LAB — CBC WITH DIFFERENTIAL/PLATELET
Basophils Absolute: 0 10*3/uL (ref 0.0–0.1)
Basophils Relative: 0.6 % (ref 0.0–3.0)
Eosinophils Absolute: 0.2 10*3/uL (ref 0.0–0.7)
Eosinophils Relative: 2.5 % (ref 0.0–5.0)
HCT: 38.9 % (ref 36.0–46.0)
Hemoglobin: 12.6 g/dL (ref 12.0–15.0)
Lymphocytes Relative: 28.4 % (ref 12.0–46.0)
Lymphs Abs: 1.9 10*3/uL (ref 0.7–4.0)
MCHC: 32.5 g/dL (ref 30.0–36.0)
MCV: 86.1 fl (ref 78.0–100.0)
Monocytes Absolute: 0.6 10*3/uL (ref 0.1–1.0)
Monocytes Relative: 8.9 % (ref 3.0–12.0)
Neutro Abs: 4 10*3/uL (ref 1.4–7.7)
Neutrophils Relative %: 59.6 % (ref 43.0–77.0)
Platelets: 304 10*3/uL (ref 150.0–400.0)
RBC: 4.52 Mil/uL (ref 3.87–5.11)
RDW: 13.5 % (ref 11.5–14.6)
WBC: 6.7 10*3/uL (ref 4.5–10.5)

## 2011-12-04 LAB — HEPATIC FUNCTION PANEL
ALT: 21 U/L (ref 0–35)
AST: 22 U/L (ref 0–37)
Albumin: 3.6 g/dL (ref 3.5–5.2)
Alkaline Phosphatase: 62 U/L (ref 39–117)
Bilirubin, Direct: 0.1 mg/dL (ref 0.0–0.3)
Total Bilirubin: 0.2 mg/dL — ABNORMAL LOW (ref 0.3–1.2)
Total Protein: 6.5 g/dL (ref 6.0–8.3)

## 2011-12-04 LAB — LIPID PANEL
Cholesterol: 207 mg/dL — ABNORMAL HIGH (ref 0–200)
HDL: 54 mg/dL (ref >39.00)
Total CHOL/HDL Ratio: 4
Triglycerides: 70 mg/dL (ref 0.0–149.0)
VLDL: 14 mg/dL (ref 0.0–40.0)

## 2011-12-04 LAB — BASIC METABOLIC PANEL
BUN: 18 mg/dL (ref 6–23)
CO2: 28 mEq/L (ref 19–32)
Calcium: 8.8 mg/dL (ref 8.4–10.5)
Chloride: 110 mEq/L (ref 96–112)
Creatinine, Ser: 0.6 mg/dL (ref 0.4–1.2)
GFR: 128.59 mL/min (ref >60.00)
Glucose, Bld: 80 mg/dL (ref 70–99)
Potassium: 4.6 mEq/L (ref 3.5–5.1)
Sodium: 143 mEq/L (ref 135–145)

## 2011-12-04 LAB — LDL CHOLESTEROL, DIRECT: Direct LDL: 132.1 mg/dL

## 2011-12-04 LAB — TSH: TSH: 1.44 u[IU]/mL (ref 0.35–5.50)

## 2011-12-04 MED ORDER — DIAZEPAM 5 MG PO TABS: 5.0000 mg | ORAL_TABLET | Freq: Every evening | ORAL | Status: DC | PRN

## 2011-12-04 NOTE — Patient Instructions (Addendum)
It was good to see you today. Health Maintenance reviewed - all recommended immunizations and age-appropriate screenings are up-to-date. Test(s) ordered today. Your results will be released to MyChart (or called to you) after review, usually within 72hours after test completion. If any changes need to be made, you will be notified at that same time. Ok to use Valium as needed for "nerves" -Your prescription(s) have been submitted to your pharmacy. Please take as directed and contact our office if you believe you are having problem(s) with the medication(s). Continue working with your other specialists as planned for your leg pain Please schedule followup in 4-6 months for weight check, call sooner if problems. Health Maintenance, Females A healthy lifestyle and preventative care can promote health and wellness.  Maintain regular health, dental, and eye exams.   Eat a healthy diet. Foods like vegetables, fruits, whole grains, low-fat dairy products, and lean protein foods contain the nutrients you need without too many calories. Decrease your intake of foods high in solid fats, added sugars, and salt. Get information about a proper diet from your caregiver, if necessary.   Regular physical exercise is one of the most important things you can do for your health. Most adults should get at least 150 minutes of moderate-intensity exercise (any activity that increases your heart rate and causes you to sweat) each week. In addition, most adults need muscle-strengthening exercises on 2 or more days a week.     Maintain a healthy weight. The body mass index (BMI) is a screening tool to identify possible weight problems. It provides an estimate of body fat based on height and weight. Your caregiver can help determine your BMI, and can help you achieve or maintain a healthy weight. For adults 20 years and older:   A BMI below 18.5 is considered underweight.   A BMI of 18.5 to 24.9 is normal.   A BMI of 25  to 29.9 is considered overweight.   A BMI of 30 and above is considered obese.   Maintain normal blood lipids and cholesterol by exercising and minimizing your intake of saturated fat. Eat a balanced diet with plenty of fruits and vegetables. Blood tests for lipids and cholesterol should begin at age 69 and be repeated every 5 years. If your lipid or cholesterol levels are high, you are over 50, or you are a high risk for heart disease, you may need your cholesterol levels checked more frequently. Ongoing high lipid and cholesterol levels should be treated with medicines if diet and exercise are not effective.   If you smoke, find out from your caregiver how to quit. If you do not use tobacco, do not start.   If you are pregnant, do not drink alcohol. If you are breastfeeding, be very cautious about drinking alcohol. If you are not pregnant and choose to drink alcohol, do not exceed 1 drink per day. One drink is considered to be 12 ounces (355 mL) of beer, 5 ounces (148 mL) of wine, or 1.5 ounces (44 mL) of liquor.   Avoid use of street drugs. Do not share needles with anyone. Ask for help if you need support or instructions about stopping the use of drugs.   High blood pressure causes heart disease and increases the risk of stroke. Blood pressure should be checked at least every 1 to 2 years. Ongoing high blood pressure should be treated with medicines, if weight loss and exercise are not effective.   If you are 55 to 66  years old, ask your caregiver if you should take aspirin to prevent strokes.   Diabetes screening involves taking a blood sample to check your fasting blood sugar level. This should be done once every 3 years, after age 40, if you are within normal weight and without risk factors for diabetes. Testing should be considered at a younger age or be carried out more frequently if you are overweight and have at least 1 risk factor for diabetes.   Breast cancer screening is essential  preventative care for women. You should practice "breast self-awareness." This means understanding the normal appearance and feel of your breasts and may include breast self-examination. Any changes detected, no matter how small, should be reported to a caregiver. Women in their 79s and 30s should have a clinical breast exam (CBE) by a caregiver as part of a regular health exam every 1 to 3 years. After age 34, women should have a CBE every year. Starting at age 63, women should consider having a mammogram (breast X-ray) every year. Women who have a family history of breast cancer should talk to their caregiver about genetic screening. Women at a high risk of breast cancer should talk to their caregiver about having an MRI and a mammogram every year.   The Pap test is a screening test for cervical cancer. Women should have a Pap test starting at age 46. Between ages 47 and 89, Pap tests should be repeated every 2 years. Beginning at age 5, you should have a Pap test every 3 years as long as the past 3 Pap tests have been normal. If you had a hysterectomy for a problem that was not cancer or a condition that could lead to cancer, then you no longer need Pap tests. If you are between ages 35 and 13, and you have had normal Pap tests going back 10 years, you no longer need Pap tests. If you have had past treatment for cervical cancer or a condition that could lead to cancer, you need Pap tests and screening for cancer for at least 20 years after your treatment. If Pap tests have been discontinued, risk factors (such as a new sexual partner) need to be reassessed to determine if screening should be resumed. Some women have medical problems that increase the chance of getting cervical cancer. In these cases, your caregiver may recommend more frequent screening and Pap tests.   The human papillomavirus (HPV) test is an additional test that may be used for cervical cancer screening. The HPV test looks for the virus  that can cause the cell changes on the cervix. The cells collected during the Pap test can be tested for HPV. The HPV test could be used to screen women aged 41 years and older, and should be used in women of any age who have unclear Pap test results. After the age of 24, women should have HPV testing at the same frequency as a Pap test.   Colorectal cancer can be detected and often prevented. Most routine colorectal cancer screening begins at the age of 5 and continues through age 60. However, your caregiver may recommend screening at an earlier age if you have risk factors for colon cancer. On a yearly basis, your caregiver may provide home test kits to check for hidden blood in the stool. Use of a small camera at the end of a tube, to directly examine the colon (sigmoidoscopy or colonoscopy), can detect the earliest forms of colorectal cancer. Talk to your caregiver  about this at age 41, when routine screening begins. Direct examination of the colon should be repeated every 5 to 10 years through age 32, unless early forms of pre-cancerous polyps or small growths are found.   Hepatitis C blood testing is recommended for all people born from [birth year] through 1965 and any individual with known risks for hepatitis C.   Practice safe sex. Use condoms and avoid high-risk sexual practices to reduce the spread of sexually transmitted infections (STIs). Sexually active women aged 61 and younger should be checked for Chlamydia, which is a common sexually transmitted infection. Older women with new or multiple partners should also be tested for Chlamydia. Testing for other STIs is recommended if you are sexually active and at increased risk.   Osteoporosis is a disease in which the bones lose minerals and strength with aging. This can result in serious bone fractures. The risk of osteoporosis can be identified using a bone density scan. Women ages 14 and over and women at risk for fractures or osteoporosis should  discuss screening with their caregivers. Ask your caregiver whether you should be taking a calcium supplement or vitamin D to reduce the rate of osteoporosis.   Menopause can be associated with physical symptoms and risks. Hormone replacement therapy is available to decrease symptoms and risks. You should talk to your caregiver about whether hormone replacement therapy is right for you.   Use sunscreen with a sun protection factor (SPF) of 30 or greater. Apply sunscreen liberally and repeatedly throughout the day. You should seek shade when your shadow is shorter than you. Protect yourself by wearing long sleeves, pants, a wide-brimmed hat, and sunglasses year round, whenever you are outdoors.   Notify your caregiver of new moles or changes in moles, especially if there is a change in shape or color. Also notify your caregiver if a mole is larger than the size of a pencil eraser.   Stay current with your immunizations.  Document Released: 07/29/2010 Document Revised: 04/07/2011 Document Reviewed: 07/29/2010 [facility] Patient Information 2013 [place], [place].

## 2011-12-04 NOTE — Progress Notes (Signed)
Subjective:    Patient ID: Patricia Ramos, female    DOB: 1946/01/15, 66 y.o.   MRN: [id number]  HPI  patient is here today for annual physical.   Patient feels well overall, but ongoing "nerve and sad" spells (death of mom last mo 10-20-2011, anniversary of dtr's death 20-Dec-2010) - ?refill valium  Since last OV (last week x2), LLE pain has gradually worsened despite ibuprofen, steroid taper and muscle relaxants - using oxy prn - pending eval by spine specialist this AM  Past Medical History  Diagnosis Date  . OBESITY     Starting weight 220 lb in 2011, then 40lb intentional weight loss, lowest 179  . DEPRESSION   . GERD   . COLONIC POLYPS, HX OF   . Anxiety    Family History  Problem Relation Age of Onset  . Hypertension Mother   . Breast cancer Mother 72  . Hypertension Father   . Diabetes Father    History  Substance Use Topics  . Smoking status: Never Smoker   . 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 Alcohol Use: No     Review of Systems Constitutional: Negative for fever or weight change.  Respiratory: Negative for cough and shortness of breath.   Cardiovascular: Negative for chest pain or palpitations.  Gastrointestinal: Negative for abdominal pain, no bowel changes.  Musculoskeletal: Negative for gait problem or joint swelling.  Skin: Negative for rash.  Neurological: Negative for dizziness or headache.  No other specific complaints in a complete review of systems (except as listed in HPI above).     Objective:   Physical Exam BP 118/80  Pulse 64  Temp 97.8 F (36.6 C) (Oral)  Ht 5\' 6"  (1.676 m)  Wt 198 lb (89.812 kg)  BMI 31.96 kg/m2  SpO2 97% Wt Readings from Last 3 Encounters:  12/04/11 198 lb (89.812 kg)  11/19/11 192 lb (87.091 kg)  08/08/11 192 lb 9.6 oz (87.363 kg)   Constitutional: She appears well-developed and well-nourished. No distress.  HENT: Head: Normocephalic and atraumatic. Ears: B TMs  ok, no erythema or effusion; Nose: Nose normal. Mouth/Throat: Oropharynx is clear and moist. No oropharyngeal exudate.  Eyes: Conjunctivae and EOM are normal. Pupils are equal, round, and reactive to light. No scleral icterus.  Neck: Normal range of motion. Neck supple. No JVD present. No thyromegaly present.  Cardiovascular: Normal rate, regular rhythm and normal heart sounds.  No murmur heard. No BLE edema. Pulmonary/Chest: Effort normal and breath sounds normal. No respiratory distress. She has no wheezes.  Abdominal: Soft. Bowel sounds are normal. She exhibits no distension. There is no tenderness. no masses Musculoskeletal: Back: full range of motion of thoracic and lumbar spine. Non tender to palpation. Positive ipsilateral straight leg raise. DTR's are symmetrically intact. Sensation intact in all dermatomes of the lower extremities. Full strength to manual muscle testing. patient is able to heel toe walk with difficulty and ambulates with antalgic gait. Neurological: She is alert and oriented to person, place, and time. No cranial nerve deficit. Coordination normal.  Skin: Skin is warm and dry. No rash noted. No erythema.  Psychiatric: She has a dsythmic mood and affect. Her behavior is normal. Judgment and thought content normal.   Lab Results  Component Value Date   WBC 5.4 05/12/2008   HGB 12.8 05/12/2008   HCT 38.8 05/12/2008   PLT 333.0 05/12/2008   GLUCOSE 98 05/12/2008   CHOL 189  05/12/2008   TRIG 38.0 05/12/2008   HDL 52.20 05/12/2008   LDLCALC 129* 05/12/2008   ALT 11 05/12/2008   AST 19 05/12/2008   NA 145 05/12/2008   K 4.5 05/12/2008   CL 109 05/12/2008   CREATININE 0.6 05/12/2008   BUN 15 05/12/2008   CO2 31 05/12/2008   TSH 0.76 05/12/2008   ECG:BSR @ 62 bpm - no ischemic change or arrhyhtmia      Assessment & Plan:   CPX/v70.0 -Patient has been counseled on age-appropriate routine health concerns for screening and prevention. These are reviewed and up-to-date. Immunizations  are up-to-date or declined. Labs ordered and ECG reviewed.  Left lumbar radiculopathy - unimproved with conservative care > 2 weeks - to see spine specialist today

## 2011-12-04 NOTE — Assessment & Plan Note (Signed)
Chronic low grade symptoms dysthmia Exacerbated by death of dtr January 10, 2011 and death of mom 10-Nov-2011 Declines need for daily med (SSRI) again at this time Denies SI/HI [facility] for prn Valium - has used 30# in past 13 months -

## 2012-02-03 ENCOUNTER — Encounter: Payer: Self-pay | Admitting: Internal Medicine

## 2012-02-03 ENCOUNTER — Ambulatory Visit (INDEPENDENT_AMBULATORY_CARE_PROVIDER_SITE_OTHER): Payer: 59 | Admitting: Internal Medicine

## 2012-02-03 VITALS — BP 124/70 | HR 76 | Temp 98.3°F

## 2012-02-03 DIAGNOSIS — J019 Acute sinusitis, unspecified: Secondary | ICD-10-CM

## 2012-02-03 DIAGNOSIS — J209 Acute bronchitis, unspecified: Secondary | ICD-10-CM

## 2012-02-03 DIAGNOSIS — F329 Major depressive disorder, single episode, unspecified: Secondary | ICD-10-CM

## 2012-02-03 MED ORDER — OXYCODONE-ACETAMINOPHEN 10-325 MG PO TABS: 1.0000 | ORAL_TABLET | Freq: Three times a day (TID) | ORAL | Status: DC | PRN

## 2012-02-03 MED ORDER — LEVOFLOXACIN 500 MG PO TABS: 500.0000 mg | ORAL_TABLET | Freq: Every day | ORAL | Status: DC

## 2012-02-03 MED ORDER — DIAZEPAM 5 MG PO TABS: 5.0000 mg | ORAL_TABLET | Freq: Every evening | ORAL | Status: DC | PRN

## 2012-02-03 MED ORDER — PROMETHAZINE-PHENYLEPHRINE 6.25-5 MG/5ML PO SYRP: 5.0000 mL | ORAL_SOLUTION | ORAL | Status: DC | PRN

## 2012-02-03 MED ORDER — PREDNISONE (PAK) 10 MG PO TABS: 10.0000 mg | ORAL_TABLET | ORAL | Status: DC

## 2012-02-03 NOTE — Progress Notes (Signed)
  Subjective:   HPI  complains of head cold symptoms, ?sinusitus Onset >2 week ago, initially improved then relapsing and now worse symptoms  First associated with rhinorrhea, sneezing, sore throat, mild headache and low grade fever Now sinus pressure and mild-mod nasal congestion, yellow-green discharge No relief with OTC meds Precipitated by sick contacts and weather change  Past Medical History  Diagnosis Date  . OBESITY     Starting weight 220 lb in 2011, then 40lb intentional weight loss, lowest 179  . DEPRESSION   . GERD   . COLONIC POLYPS, HX OF   . Anxiety     Review of Systems Constitutional: No night sweats, no unexpected weight change Pulmonary: No pleurisy or hemoptysis Cardiovascular: No chest pain or palpitations     Objective:   Physical Exam BP 124/70  Pulse 76  Temp 98.3 F (36.8 C) (Oral)  SpO2 95% GEN: mildly ill appearing and audible head/chest congestion HENT: NCAT, mild sinus tenderness bilaterally, nares with thick discharge and turbinate swelling, oropharynx mild erythema and PND, no exudate Eyes: Vision grossly intact, no conjunctivitis Lungs: B rhonchi with end exp wheeze, no increased work of breathing but cough spasms Cardiovascular: Regular rate and rhythm, no bilateral edema  Lab Results  Component Value Date   WBC 6.7 12/04/2011   HGB 12.6 12/04/2011   HCT 38.9 12/04/2011   PLT 304.0 12/04/2011   GLUCOSE 80 12/04/2011   CHOL 207* 12/04/2011   TRIG 70.0 12/04/2011   HDL 54.00 12/04/2011   LDLDIRECT 132.1 12/04/2011   LDLCALC 129* 05/12/2008   ALT 21 12/04/2011   AST 22 12/04/2011   NA 143 12/04/2011   K 4.6 12/04/2011   CL 110 12/04/2011   CREATININE 0.6 12/04/2011   BUN 18 12/04/2011   CO2 28 12/04/2011   TSH 1.44 12/04/2011      Assessment & Plan:  Viral URI > progression to acute sinusitis Bronchitis with bronchospasm Cough, postnasal drip related to above   Empiric antibiotics prescribed due to symptom duration greater than 7 days  and progression despite OTC symptomatic care Pred taper and prescription cough suppression - new prescriptions done Symptomatic care with Tylenol or Advil, decongestants, antihistamine, hydration and rest -  Saline irrigation and salt gargle advised as needed

## 2012-02-03 NOTE — Patient Instructions (Signed)
It was good to see you today. Levquin antibiotics, prednisone taper for wheeze and prescription cough syrup - Your prescription(s) have been submitted to your pharmacy. Please take as directed and contact our office if you believe you are having problem(s) with the medication(s). Alternate between ibuprofen and tylenol for aches, pain and fever symptoms as discussed Refills on medication(s) as discussed today. Hydrate, rest and call if worse or unimproved

## 2012-02-03 NOTE — Assessment & Plan Note (Signed)
Chronic low grade symptoms dysthmia Exacerbated by death of dtr 2010-12-11 and death of mom Oct 11, 2011 Declines need for daily med (SSRI) again at this time Denies SI/HI [facility] for prn Valium - has used <30 in past 2 months - refill today

## 2012-02-24 ENCOUNTER — Other Ambulatory Visit (HOSPITAL_COMMUNITY): Payer: Self-pay | Admitting: Orthopedic Surgery

## 2012-02-24 DIAGNOSIS — Z96659 Presence of unspecified artificial knee joint: Secondary | ICD-10-CM | POA: Insufficient documentation

## 2012-02-24 DIAGNOSIS — M25569 Pain in unspecified knee: Secondary | ICD-10-CM

## 2012-03-08 ENCOUNTER — Encounter (HOSPITAL_COMMUNITY)
Admission: RE | Admit: 2012-03-08 | Discharge: 2012-03-08 | Disposition: A | Discharge status: Home / Self Care | Payer: Medicare Other | Source: Ambulatory Visit | Attending: Orthopedic Surgery | Admitting: Orthopedic Surgery

## 2012-03-08 DIAGNOSIS — Z96659 Presence of unspecified artificial knee joint: Secondary | ICD-10-CM | POA: Insufficient documentation

## 2012-03-08 DIAGNOSIS — M25569 Pain in unspecified knee: Secondary | ICD-10-CM | POA: Insufficient documentation

## 2012-03-08 MED ORDER — TECHNETIUM TC 99M MEDRONATE IV KIT
25.0000 | PACK | Freq: Once | INTRAVENOUS | Status: AC | PRN
  Administered 2012-03-08: 25 via INTRAVENOUS

## 2012-03-24 ENCOUNTER — Encounter: Payer: Self-pay | Admitting: Internal Medicine

## 2012-03-24 ENCOUNTER — Ambulatory Visit (INDEPENDENT_AMBULATORY_CARE_PROVIDER_SITE_OTHER)
Admission: RE | Admit: 2012-03-24 | Discharge: 2012-03-24 | Disposition: A | Discharge status: Home / Self Care | Payer: Medicare Other | Source: Ambulatory Visit | Attending: Internal Medicine | Admitting: Internal Medicine

## 2012-03-24 ENCOUNTER — Ambulatory Visit (INDEPENDENT_AMBULATORY_CARE_PROVIDER_SITE_OTHER): Payer: Medicare Other | Admitting: Internal Medicine

## 2012-03-24 VITALS — BP 120/82 | HR 89 | Temp 98.3°F

## 2012-03-24 DIAGNOSIS — W19XXXA Unspecified fall, initial encounter: Secondary | ICD-10-CM

## 2012-03-24 DIAGNOSIS — M7918 Myalgia, other site: Secondary | ICD-10-CM

## 2012-03-24 DIAGNOSIS — IMO0001 Reserved for inherently not codable concepts without codable children: Secondary | ICD-10-CM

## 2012-03-24 MED ORDER — KETOROLAC TROMETHAMINE 60 MG/2ML IM SOLN
60.0000 mg | Freq: Once | INTRAMUSCULAR | Status: AC
  Administered 2012-03-24: 60 mg via INTRAMUSCULAR

## 2012-03-24 MED ORDER — PREDNISONE (PAK) 10 MG PO TABS: 10.0000 mg | ORAL_TABLET | ORAL | Status: DC

## 2012-03-24 MED ORDER — HYDROCODONE-ACETAMINOPHEN 5-325 MG PO TABS: 1.0000 | ORAL_TABLET | Freq: Four times a day (QID) | ORAL | Status: DC | PRN

## 2012-03-24 NOTE — Patient Instructions (Signed)
It was good to see you today. High-dose Toradol shot given for pain in office today xrays ordered today. Your results will be released to MyChart (or called to you) after review, usually within 72hours after test completion. If any changes need to be made, you will be notified at that same time. Continue the ibuprofen and use "norco: as needed for pain -Your prescription(s) have been submitted to your pharmacy. Please take as directed and contact our office if you believe you are having problem(s) with the medication(s).

## 2012-03-24 NOTE — Progress Notes (Signed)
  Subjective:    Patient ID: Patricia Ramos, female    DOB: 1945/04/22, 67 y.o.   MRN: [id number]  HPI  complains of R buttock pain Onset 6 weeks ago  Fall summer 2013 with LLE pain, family resolved over 3 months time Now with new accidental fall 6 weeks ago, persisting right buttock pain radiating to posterior mid thigh Not improved despite ibuprofen- also occ using oxy prn - causing sedation -  Pain worse sitting - causes pain R buttock  pending "redo" R knee by ortho  Past Medical History  Diagnosis Date  . OBESITY     Starting weight 220 lb in 2011, then 40lb intentional weight loss, lowest 179  . DEPRESSION   . GERD   . COLONIC POLYPS, HX OF   . Anxiety     Review of Systems  Genitourinary: Positive for pelvic pain (r buttck/sitting). Negative for decreased urine volume.  Musculoskeletal: Negative for myalgias, joint swelling and gait problem.      Objective:   Physical Exam  BP 120/82  Pulse 89  Temp(Src) 98.3 F (36.8 C) (Oral)  SpO2 98% Wt Readings from Last 3 Encounters:  12/04/11 198 lb (89.812 kg)  11/19/11 192 lb (87.091 kg)  08/08/11 192 lb 9.6 oz (87.363 kg)   Constitutional: She appears well-developed and well-nourished. No distress.  Cardiovascular: Normal rate, regular rhythm and normal heart sounds.  No murmur heard. No BLE edema. Pulmonary/Chest: Effort normal and breath sounds normal. No respiratory distress. She has no wheezes.  Musculoskeletal: Back: full range of motion of thoracic and lumbar spine. Non tender to palpation over lumbar vertebra and sacrum but tender over right ischium. Negative ipsilateral straight leg raise. DTR's are symmetrically intact. Sensation intact in all dermatomes of the lower extremities. Full strength to manual muscle testing. patient is able to heel toe walk with difficulty but ambulates with Slightly antalgic gait. Skin: No bruises or hematoma evident Psychiatric: She has a normal mood and affect. Her behavior is  normal. Judgment and thought content normal.   Lab Results  Component Value Date   WBC 6.7 12/04/2011   HGB 12.6 12/04/2011   HCT 38.9 12/04/2011   PLT 304.0 12/04/2011   GLUCOSE 80 12/04/2011   CHOL 207* 12/04/2011   TRIG 70.0 12/04/2011   HDL 54.00 12/04/2011   LDLDIRECT 132.1 12/04/2011   LDLCALC 129* 05/12/2008   ALT 21 12/04/2011   AST 22 12/04/2011   NA 143 12/04/2011   K 4.6 12/04/2011   CL 110 12/04/2011   CREATININE 0.6 12/04/2011   BUN 18 12/04/2011   CO2 28 12/04/2011   TSH 1.44 12/04/2011       Assessment & Plan:   Right buttock pain X6 weeks following accidental trauma   Suspect "bone bruise" with peripheral neuropathy  Musculoskeletal and neuro exam intact today  Check plain film to exclude pelvic fracture or sacral fracture  IM Toradol 60 mg given today Prednisone taper x6 days for inflammation and continue low-dose hydrocodone as needed for unrelieved pain Educated on need for time to resolve, expect 12 weeks from injury Patient call sooner if symptoms worse or unimproved

## 2012-04-06 ENCOUNTER — Other Ambulatory Visit: Payer: Self-pay | Admitting: Internal Medicine

## 2012-05-04 ENCOUNTER — Other Ambulatory Visit: Payer: Self-pay | Admitting: Internal Medicine

## 2012-06-08 ENCOUNTER — Other Ambulatory Visit: Payer: Self-pay | Admitting: Internal Medicine

## 2012-07-08 ENCOUNTER — Other Ambulatory Visit: Payer: Self-pay | Admitting: Internal Medicine

## 2012-10-22 ENCOUNTER — Ambulatory Visit (INDEPENDENT_AMBULATORY_CARE_PROVIDER_SITE_OTHER): Payer: Medicare Other | Admitting: Internal Medicine

## 2012-10-22 ENCOUNTER — Encounter: Payer: Self-pay | Admitting: Internal Medicine

## 2012-10-22 VITALS — BP 132/90 | HR 68 | Temp 98.4°F | Wt 199.4 lb

## 2012-10-22 DIAGNOSIS — M79609 Pain in unspecified limb: Secondary | ICD-10-CM

## 2012-10-22 DIAGNOSIS — M79604 Pain in right leg: Secondary | ICD-10-CM

## 2012-10-22 DIAGNOSIS — Z23 Encounter for immunization: Secondary | ICD-10-CM

## 2012-10-22 DIAGNOSIS — M545 Low back pain, unspecified: Secondary | ICD-10-CM

## 2012-10-22 DIAGNOSIS — F329 Major depressive disorder, single episode, unspecified: Secondary | ICD-10-CM

## 2012-10-22 DIAGNOSIS — IMO0002 Reserved for concepts with insufficient information to code with codable children: Secondary | ICD-10-CM

## 2012-10-22 DIAGNOSIS — M5416 Radiculopathy, lumbar region: Secondary | ICD-10-CM

## 2012-10-22 MED ORDER — KETOROLAC TROMETHAMINE 60 MG/2ML IM SOLN
60.0000 mg | Freq: Once | INTRAMUSCULAR | Status: AC
  Administered 2012-10-22: 60 mg via INTRAMUSCULAR

## 2012-10-22 MED ORDER — DIAZEPAM 5 MG PO TABS: 5.0000 mg | ORAL_TABLET | Freq: Every evening | ORAL | Status: DC | PRN

## 2012-10-22 MED ORDER — HYDROCODONE-ACETAMINOPHEN 5-325 MG PO TABS: 1.0000 | ORAL_TABLET | Freq: Four times a day (QID) | ORAL | Status: DC | PRN

## 2012-10-22 NOTE — Progress Notes (Signed)
Subjective:    Patient ID: Patricia Ramos, female    DOB: 1945/02/20, 67 y.o.   MRN: [id number]  Back Pain Associated symptoms include pelvic pain (r buttck/sitting) and weakness (diffuse/generalized). Pertinent negatives include no chest pain, fever or headaches.   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 OBESITY     Starting weight 220 lb in 2011, then 40lb intentional weight loss, lowest 179  . DEPRESSION   . GERD   . COLONIC POLYPS, HX OF   . Anxiety    Family History  Problem Relation Age of Onset  . Hypertension Mother   . Breast cancer Mother 57  . Hypertension Father   . Diabetes Father    History  Substance Use Topics  . Smoking status: Never Smoker   . Smokeless tobacco: Not on file     Comment: Divorced-lives with 67 yo g-dtr. retired Midwife, working at [employer] since 2011  . Alcohol Use: No    Review of Systems  Constitutional: Positive for fatigue. Negative for fever and unexpected weight change.  Respiratory: Negative for cough and shortness of breath.   Cardiovascular: Negative for chest pain, palpitations and leg swelling.  Genitourinary: Positive for pelvic pain (r buttck/sitting). Negative for frequency, hematuria, flank pain and decreased urine volume.  Musculoskeletal: Positive for back pain and arthralgias. Negative for myalgias, joint swelling and gait  problem.  Neurological: Positive for weakness (diffuse/generalized). Negative for dizziness and headaches.  Hematological: Does not bruise/bleed easily.  Psychiatric/Behavioral: Positive for dysphoric mood and decreased concentration. Negative for behavioral problems, confusion and self-injury. The patient is not nervous/anxious.   other systems are reviewed and negative except as listed in history of present illness above     Objective:   Physical Exam BP 132/90  Pulse 68  Temp(Src) 98.4 F (36.9 C) (Oral)  Wt 199 lb 6.4 oz (90.447 kg)  BMI 32.2 kg/m2  SpO2 97% Wt Readings from Last 3 Encounters:  10/22/12 199 lb 6.4 oz (90.447 kg)  12/04/11 198 lb (89.812 kg)  11/19/11 192 lb (87.091 kg)   Constitutional: She is obese, but appears well-developed and well-nourished. No distress.  Cardiovascular: Normal rate, regular rhythm and normal heart sounds.  No murmur heard. No BLE edema. Pulmonary/Chest: Effort normal and breath sounds normal. No respiratory distress. She has no wheezes.  Musculoskeletal: Back: full range of motion of thoracic and lumbar spine. Non tender to palpation over lumbar vertebra and sacrum but tender over right ischium. Negative ipsilateral straight leg raise. DTR's are symmetrically intact. Sensation intact in all dermatomes of the lower extremities. Full strength to manual muscle testing. patient is able to heel toe walk with difficulty but ambulates with antalgic gait. Skin: No bruises or hematoma evident Over low back or right buttock Psychiatric: She has a normal mood and affect. Her behavior is normal. Judgment and thought content normal.  Lab Results  Component Value Date   WBC 6.7 12/04/2011   HGB 12.6 12/04/2011   HCT 38.9 12/04/2011   PLT 304.0 12/04/2011   GLUCOSE 80 12/04/2011   CHOL 207* 12/04/2011   TRIG 70.0 12/04/2011   HDL 54.00 12/04/2011   LDLDIRECT 132.1 12/04/2011   LDLCALC 129* 05/12/2008   ALT 21 12/04/2011   AST 22 12/04/2011   NA 143  12/04/2011   K 4.6 12/04/2011   CL 110 12/04/2011   CREATININE 0.6 12/04/2011   BUN 18 12/04/2011   CO2 28 12/04/2011   TSH 1.44 12/04/2011   MRI lumbar spine 03/18/06: Clinical Data: 67 year old with severe low back and bilateral leg pain.  MRI LUMBAR SPINE WITHOUT CONTRAST:  Technique: Multiplanar and multiecho pulse sequences of the lumbar spine, to include the lower thoracic and upper sacral regions, were obtained according to standard protocol without IV contrast.  Comparison: None.  Findings: The sagittal MR images demonstrate normal alignment of the lumbar vertebral bodies. They demonstrate normal marrow signal except for a few small scattered benign appearing hemangiomas. Intervertebral discs are fairly well maintained.  The last full intervertebral disc space is labeled L5-S1 and the conus medullaris terminates at the bottom of T12.  T12-L1: Very shallow right paracentral disc protrusion. Minimal impression on the right side of the thecal sac.  L1-2: No significant findings.  L2-3: Mild hypertrophic facet disease. No significant spinal, lateral recess, or foraminal stenosis. No focal disc protrusion.  L3-4: Diffuse bulging annulus which is mild. There is also facet disease and ligamentum flavum thickening and the combination creates early spinal and lateral recess stenosis. No foraminal stenosis.  L4-5: Mild bulging annulus and facet disease contribute to mild spinal and lateral recess stenosis. No foraminal stenosis.  L5-S1: Hypertrophic facet disease. No focal disc protrusions. No significant spinal, lateral recess, or foraminal recess.  There is a cystic structure behind the S2 vertebral body which is likely a benign Tarlov cyst or dilated sacral nerve root sheath.  IMPRESSION:  1. Shallow right paracentral disc protrusion at T12-L1.  2. 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  3. Hypertrophic facet disease particularly in the lower lumbar spine without  definite pars defects.  4. 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   Dg Pelvis 1-2 Views  03/24/2012   *RADIOLOGY REPORT*  Clinical Data: Fall.  Pain.  PELVIS - 1-2 VIEW  Comparison: None.  Findings: The pelvis is intact.  No acute bone or soft tissue abnormalities are present.  IMPRESSION: Negative single view pelvis.   Original Report Authenticated By: Marin Roberts, M.D.   Dg Sacrum/coccyx  03/24/2012   *RADIOLOGY REPORT*  Clinical Data: Right buttock pain extending into the lower extremity.  Status post fall.  SACRUM AND COCCYX - 2+ VIEW  Comparison: One-view pelvis 03/24/2012.  Findings: The sacrum and coccyx are intact.  No acute bone or soft tissue abnormalities are present.  Degenerative changes are noted at L4-5 and L5-S1.  IMPRESSION:  1.  No acute abnormality. 2.  Degenerative changes are present at L4-5 and L5-S1.   Original Report Authenticated By: Marin Roberts, M.D.     Assessment & Plan:   Right buttock pain and low back pain since 01/2012 following accidental trauma - unimproved, gradually worse Not improved with scheduled NSAIDs (ibuprofen) Neuro exam motor intact today   Check MRI L spine to exclude HNP causing radicular pain unimproved with conservative care  IM Toradol 60  mg given today hydrocodone as needed for unrelieved pain Refer to spine specialist as needed based on MRI report Patient agrees to call sooner if symptoms worse or unimproved in next 2-4 weeks  Time spent with pt today 40 minutes, greater than 50% time spent counseling patient on low back pain, right lumbar radiculopathy and medication review. Also review of prior records

## 2012-10-22 NOTE — Assessment & Plan Note (Signed)
Chronic low grade symptoms dysthmia Exacerbated by death of dtr 12/25/10 and death of mom 2011-10-25 -now exacerbated by painful condition ( back, right leg, see above) Declines need for daily med (SSRI) again at this time Denies SI/HI [facility] for prn Valium - has used <30 in past 2 months - refill today

## 2012-10-22 NOTE — Patient Instructions (Signed)
It was good to see you today. High-dose Toradol shot given for pain in office today MRI lumbar spine ordered today. Your results will be released to MyChart (or called to you) after review, usually within 72hours after test completion. If any changes need to be made, you will be notified at that same time. Continue the ibuprofen and use "norco" as needed for pain - Also refill on Valium Your prescription(s) have been submitted to your pharmacy. Please take as directed and contact our office if you believe you are having problem(s) with the medication(s). Please schedule followup in 2-4 weeks for review, call sooner if problems.   My medical records reflect: You had accidental fall at work January 2014. Office visit with me 03/24/2012 for low back pain and right buttock pain. Plain x-rays negative for pelvic fracture Toradol shot, Norco prescription ibuprofen prescribed Return office visit with me today September 26 for continued pain in right buttock and low back Order MRI lumbar spine to rule out nerve damage Repeat Toradol shot and renew prescription for Norco and Valium today Will plan referral to back specialist pending report from MRI

## 2012-10-25 ENCOUNTER — Other Ambulatory Visit: Payer: Self-pay | Admitting: *Deleted

## 2012-10-25 DIAGNOSIS — M5416 Radiculopathy, lumbar region: Secondary | ICD-10-CM

## 2012-10-25 NOTE — Telephone Encounter (Signed)
Left msg on vm stating pain med that md rx is not working. Med is giving her a headache. Requesting md to rx something else...lmb

## 2012-10-26 ENCOUNTER — Telehealth: Payer: Self-pay | Admitting: Internal Medicine

## 2012-10-26 MED ORDER — TRAMADOL HCL 50 MG PO TABS: 50.0000 mg | ORAL_TABLET | Freq: Three times a day (TID) | ORAL | Status: DC | PRN

## 2012-10-26 NOTE — Telephone Encounter (Signed)
A) try tramadol instead of hydrocodone - please have a covering doc sign this rx if unable to call in (see below new rx) B) i will make refer to back specialist now pending this MRI completion -  Thanks!

## 2012-10-26 NOTE — Telephone Encounter (Signed)
Notified pt with md response. Requesting tramadol. [facility] spoke with Jim/pharmacist gave authorization...lmb

## 2012-10-26 NOTE — Telephone Encounter (Signed)
Spoke with Dr. Felicity Coyer verifying MRI that was order before calling pt. MD states that is the correct test that she want. Test will pick up her back as well buttock area. Called pt back inform her what md stated...lmb

## 2012-10-26 NOTE — Telephone Encounter (Signed)
Pt fell on her right butt check, she does not just want to have MRI of L spine. She doesn't think it will show everything.  Please Advise. Also request to speak to [facility]

## 2012-11-01 ENCOUNTER — Telehealth: Payer: Self-pay | Admitting: *Deleted

## 2012-11-01 ENCOUNTER — Ambulatory Visit
Admission: RE | Admit: 2012-11-01 | Discharge: 2012-11-01 | Disposition: A | Discharge status: Home / Self Care | Payer: Medicare Other | Source: Ambulatory Visit | Attending: Internal Medicine | Admitting: Internal Medicine

## 2012-11-01 DIAGNOSIS — M7918 Myalgia, other site: Secondary | ICD-10-CM

## 2012-11-01 DIAGNOSIS — M545 Low back pain, unspecified: Secondary | ICD-10-CM

## 2012-11-01 DIAGNOSIS — M79604 Pain in right leg: Secondary | ICD-10-CM

## 2012-11-01 DIAGNOSIS — M5416 Radiculopathy, lumbar region: Secondary | ICD-10-CM

## 2012-11-01 MED ORDER — PREDNISONE (PAK) 10 MG PO TABS: 10.0000 mg | ORAL_TABLET | ORAL | Status: DC

## 2012-11-01 NOTE — Telephone Encounter (Signed)
Since patient is reluctant to accept that her buttock pain is from her back, I will refer her to a general orthopedist who can evaluate her buttock pain for a second opinion. Will cancel refer to "back" specialist. Also repeat prednisone pak -taper over 12days to treat pain symptoms - no other changes recommended

## 2012-11-01 NOTE — Telephone Encounter (Signed)
Called pt concerning MRI results. Pt states she is not satisfied the pain that she is having is her buttock as she stated before. She always new that she had disc problem in her back. Had MRI several years ago and was told the same information. She states she is not able to sit on her (R) buttock. Something is definitely wrong. Was told by md that the MRI would pick up buttock area & it did not. Does not need to see a back specialist because it not her back. Pain meds that md rx is not helping at all. Want something done for her buttock pain...Raechel Chute

## 2012-11-02 NOTE — Telephone Encounter (Signed)
Notified pt with md response.../lmb 

## 2013-02-07 ENCOUNTER — Other Ambulatory Visit: Payer: Self-pay | Admitting: Internal Medicine

## 2013-02-08 NOTE — Telephone Encounter (Signed)
[facility].../lmb 

## 2013-04-26 LAB — HM MAMMOGRAPHY

## 2013-04-27 ENCOUNTER — Encounter: Payer: Self-pay | Admitting: Internal Medicine

## 2013-06-07 ENCOUNTER — Other Ambulatory Visit: Payer: Self-pay | Admitting: Internal Medicine

## 2013-06-07 NOTE — Telephone Encounter (Signed)
Both #30. No refills

## 2013-06-07 NOTE — Telephone Encounter (Signed)
MD out of office  Last ov 10/22/2012 Med last filled 02/07/2013

## 2013-06-10 ENCOUNTER — Encounter: Payer: Self-pay | Admitting: Internal Medicine

## 2013-06-10 ENCOUNTER — Ambulatory Visit (INDEPENDENT_AMBULATORY_CARE_PROVIDER_SITE_OTHER): Payer: Medicare Other | Admitting: Internal Medicine

## 2013-06-10 VITALS — BP 112/70 | HR 71 | Temp 98.2°F | Ht 66.0 in | Wt 198.0 lb

## 2013-06-10 DIAGNOSIS — Z86010 Personal history of colonic polyps: Secondary | ICD-10-CM

## 2013-06-10 DIAGNOSIS — M1710 Unilateral primary osteoarthritis, unspecified knee: Secondary | ICD-10-CM

## 2013-06-10 DIAGNOSIS — E785 Hyperlipidemia, unspecified: Secondary | ICD-10-CM

## 2013-06-10 DIAGNOSIS — Z0000 Encounter for general adult medical examination without abnormal findings: Secondary | ICD-10-CM

## 2013-06-10 DIAGNOSIS — IMO0002 Reserved for concepts with insufficient information to code with codable children: Secondary | ICD-10-CM

## 2013-06-10 DIAGNOSIS — M1711 Unilateral primary osteoarthritis, right knee: Secondary | ICD-10-CM | POA: Insufficient documentation

## 2013-06-10 DIAGNOSIS — R1319 Other dysphagia: Secondary | ICD-10-CM

## 2013-06-10 MED ORDER — HYDROCODONE-ACETAMINOPHEN 5-325 MG PO TABS: 1.0000 | ORAL_TABLET | Freq: Four times a day (QID) | ORAL | Status: DC | PRN

## 2013-06-10 MED ORDER — TRAMADOL HCL 50 MG PO TABS: 50.0000 mg | ORAL_TABLET | Freq: Three times a day (TID) | ORAL | Status: DC | PRN

## 2013-06-10 MED ORDER — MOMETASONE FUROATE 0.1 % EX CREA: TOPICAL_CREAM | CUTANEOUS | Status: DC

## 2013-06-10 MED ORDER — ALPRAZOLAM 0.25 MG PO TABS: 0.2500 mg | ORAL_TABLET | Freq: Two times a day (BID) | ORAL | Status: DC | PRN

## 2013-06-10 NOTE — Patient Instructions (Signed)
Reflux of gastric acid may be asymptomatic as this may occur mainly during sleep.The triggers for reflux  include stress; the "aspirin family" ; alcohol; peppermint; and caffeine (coffee, tea, cola, and chocolate). The aspirin family would include aspirin and the nonsteroidal agents such as ibuprofen &  Naproxen. Tylenol would not cause reflux. If having symptoms ; food & drink should be avoided for @ least 2 hours before going to bed.  

## 2013-06-10 NOTE — Progress Notes (Signed)
Pre visit review using our clinic review tool, if applicable. No additional management support is needed unless otherwise documented below in the visit note. 

## 2013-06-10 NOTE — Progress Notes (Signed)
Subjective:    Patient ID: Patricia Ramos, female    DOB: 01/19/1946, 68 y.o.   MRN: [id number]  HPI  It was recommended by her insurance company, [facility], that she have a wellness exam. Medicare Wellness Visit: Psychosocial and medical history were reviewed as required by Medicare (history related to abuse, antisocial behavior , firearm risk). Social history: Caffeine:1 cup tea  , Alcohol: no , Tobacco use: never Exercise:walking 2.5 mpd  Personal safety/fall risk: no Limitations of activities of daily living:no Seatbelt/ smoke alarm use:yes Healthcare Power of Attorney/Living Will status: not UTD; reason for such discussed Ophthalmologic exam status:UTD Hearing evaluation status:UTD Orientation: Oriented X 3 Memory and recall: good Spelling  testing: good Depression/anxiety assessment: no BUT she lost her adult daughter last year & is raising her teen aged granddaughter. Strong spiritual support system through her church. Foreign travel history: [place] 2009 Immunization status for influenza/pneumonia/ shingles /tetanus:UTD Transfusion history:no Preventive health care maintenance status: Colonoscopy/BMD/mammogram/Pap as per protocol/standard care: ? Colonoscopy due (no data under Past Surgical History) Dental care: every 6 months Chart reviewed and updated. Active issues reviewed and addressed as documented below.    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 She now takes ibuprofen 800 mg once a day after her walk. On Sundays the walk is much longer and she will take hydrocodone because the knee  stiffens.  She takes Pepcid because of significant dyspepsia. If she eats  greasy foods she will have dysphagia. She denies unexplained weight loss, melena, rectal bleeding.    Objective:   Physical Exam Gen.: Healthy and well-nourished in appearance. Alert, appropriate and  cooperative throughout exam. Appears younger than stated age  Head: Normocephalic without obvious abnormalities  Eyes: No corneal or conjunctival inflammation noted. Pupils equal round reactive to light and accommodation. Extraocular motion intact.  Ears: External  ear exam reveals no significant lesions or deformities. Canals clear .TMs normal. Hearing is grossly normal bilaterally. Nose: External nasal exam reveals no deformity or inflammation. Nasal mucosa are pink and moist. No lesions or exudates noted.   Mouth: Oral mucosa and oropharynx reveal no lesions or exudates. Teeth in good repair. Neck: No deformities, masses, or tenderness noted. Range of motion normal. Thyroid normal. Lungs: Normal respiratory effort; chest expands symmetrically. Lungs are clear to auscultation without rales, wheezes, or increased work of breathing. Heart: Normal rate and rhythm. Normal S1 and S2. No gallop, click, or rub.  No murmur. Abdomen: Bowel sounds normal; abdomen soft and nontender. No masses, organomegaly or hernias noted.                               Musculoskeletal/extremities: No deformity or scoliosis noted of  the thoracic or lumbar spine.  No clubbing, cyanosis, edema, or significant extremity  deformity noted. Range of motion normal .Tone & strength normal. Hand joints normal Fingernail health good. She has crepitus bilaterally; this is more striking than the left knee than the right. Able to lie down & up w/o help. Negative SLR bilaterally Vascular: Carotid, radial artery, dorsalis pedis and  posterior tibial pulses are full and equal. No bruits present. Neurologic: Alert and oriented x3. Deep tendon reflexes symmetrical and normal.  Gait normal  Skin: Intact without suspicious lesions or rashes. Lymph: No cervical, axillary lymphadenopathy present. Psych: Mood and affect are normal. Normally interactive. She is  incredibly personable & upbeat. She is ex Engineer, structural & is cognizant of  adverse outcomes with controlled substances. Her refill history documents absence of abuse.She is anxious to adjust medical regimen to decrease long term risks.                                                                                    Assessment & Plan:  #1 Medicare wellness exam as requested by [facility]  #2 dyspepsia and specific food trigger dysphagia in the context of nonsteroidal use.  Long term risk related to GI and cardiac issues with ibuprofen were discussed. Tramadol would be safer in reference to these risks and ibuprofen. She desires to pursue this. If dysphagia recurs with anti reflux measures & D/C of NSAID ; GI referral indicated. She is working 2 jobs and raising her 20-year-old granddaughter. She is questioning using Xanax in place of Valium. This would certainly be shorter acting. She exhibits no addictive historical pattern (see Pysch above).

## 2013-06-11 ENCOUNTER — Encounter: Payer: Self-pay | Admitting: Internal Medicine

## 2013-06-11 DIAGNOSIS — E785 Hyperlipidemia, unspecified: Secondary | ICD-10-CM | POA: Insufficient documentation

## 2013-06-11 DIAGNOSIS — E669 Obesity, unspecified: Secondary | ICD-10-CM

## 2013-06-11 DIAGNOSIS — R1319 Other dysphagia: Secondary | ICD-10-CM | POA: Insufficient documentation

## 2013-06-13 ENCOUNTER — Telehealth: Payer: Self-pay | Admitting: *Deleted

## 2013-06-13 NOTE — Telephone Encounter (Signed)
Message copied by Earnstine Regal on Mon Jun 13, 2013 10:17 AM ------      Message from: Hendricks Limes      Created: Sat Jun 11, 2013  9:11 AM       Please  Come in for  fasting Labs : BMET,Lipids, hepatic panel, CBC & dif, TSH within next month.      Orders entered . Last labs 2013 ------

## 2013-06-13 NOTE — Telephone Encounter (Signed)
Notified pt with md response.../lmb 

## 2013-08-09 ENCOUNTER — Telehealth: Payer: Self-pay | Admitting: Internal Medicine

## 2013-08-09 NOTE — Telephone Encounter (Signed)
Pt called request phone call explain about the bill that she got. Pt stated she came in and get the wellness on 06/10/13 and she did not understand why she got a bill for that. Please help pt.

## 2013-09-10 IMAGING — CR DG PELVIS 1-2V
1 series · 1 of 1 positions shown · non-contrast
Comparison: None.

CLINICAL DATA: Fall.  Pain.

PELVIS - 1-2 VIEW

[view not recorded]
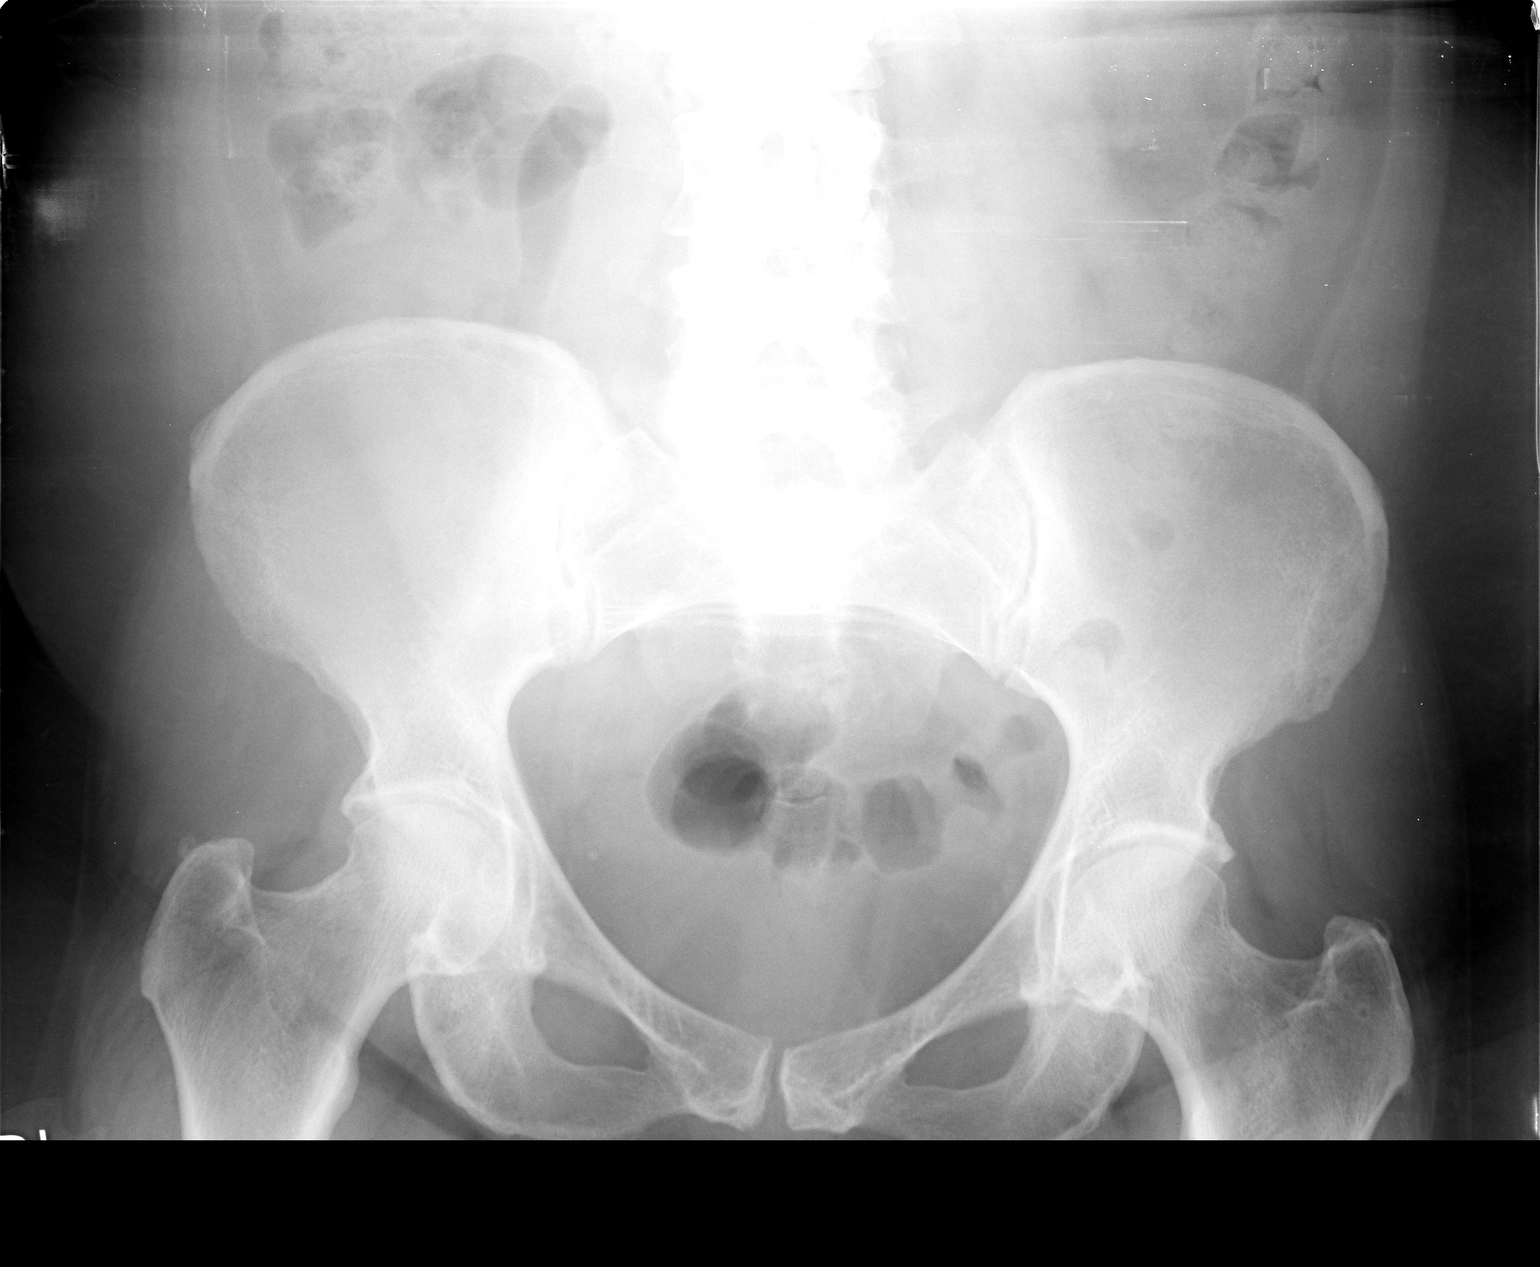

[1 of 1 positions shown; findings below may reference images not displayed]

FINDINGS: The pelvis is intact.  No acute bone or soft tissue
abnormalities are present.
IMPRESSION: Negative single view pelvis.

## 2013-09-10 IMAGING — CR DG SACRUM/COCCYX 2+V
3 series · 3 of 3 positions shown · non-contrast
Comparison: One-view pelvis 03/24/2012.

CLINICAL DATA: Right buttock pain extending into the lower
extremity.  Status post fall.

SACRUM AND COCCYX - 2+ VIEW

[view not recorded (1 of 3)]
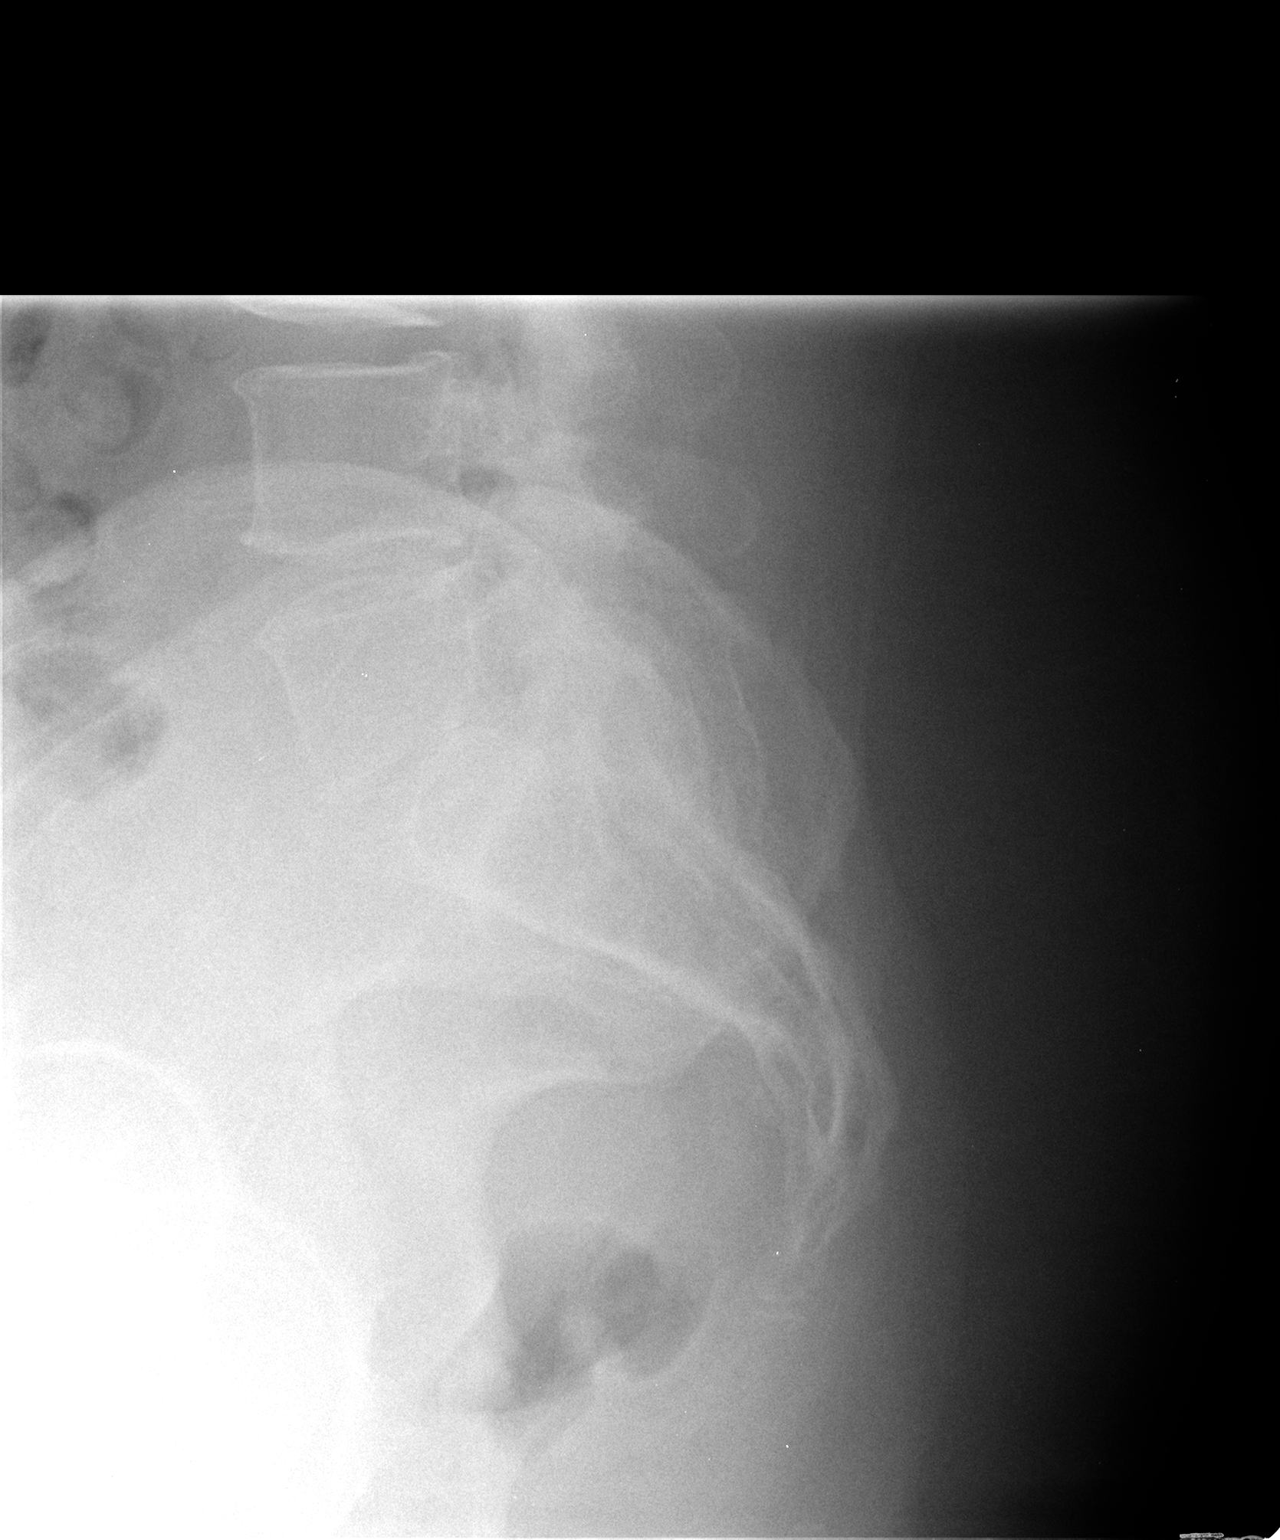

[view not recorded (2 of 3)]
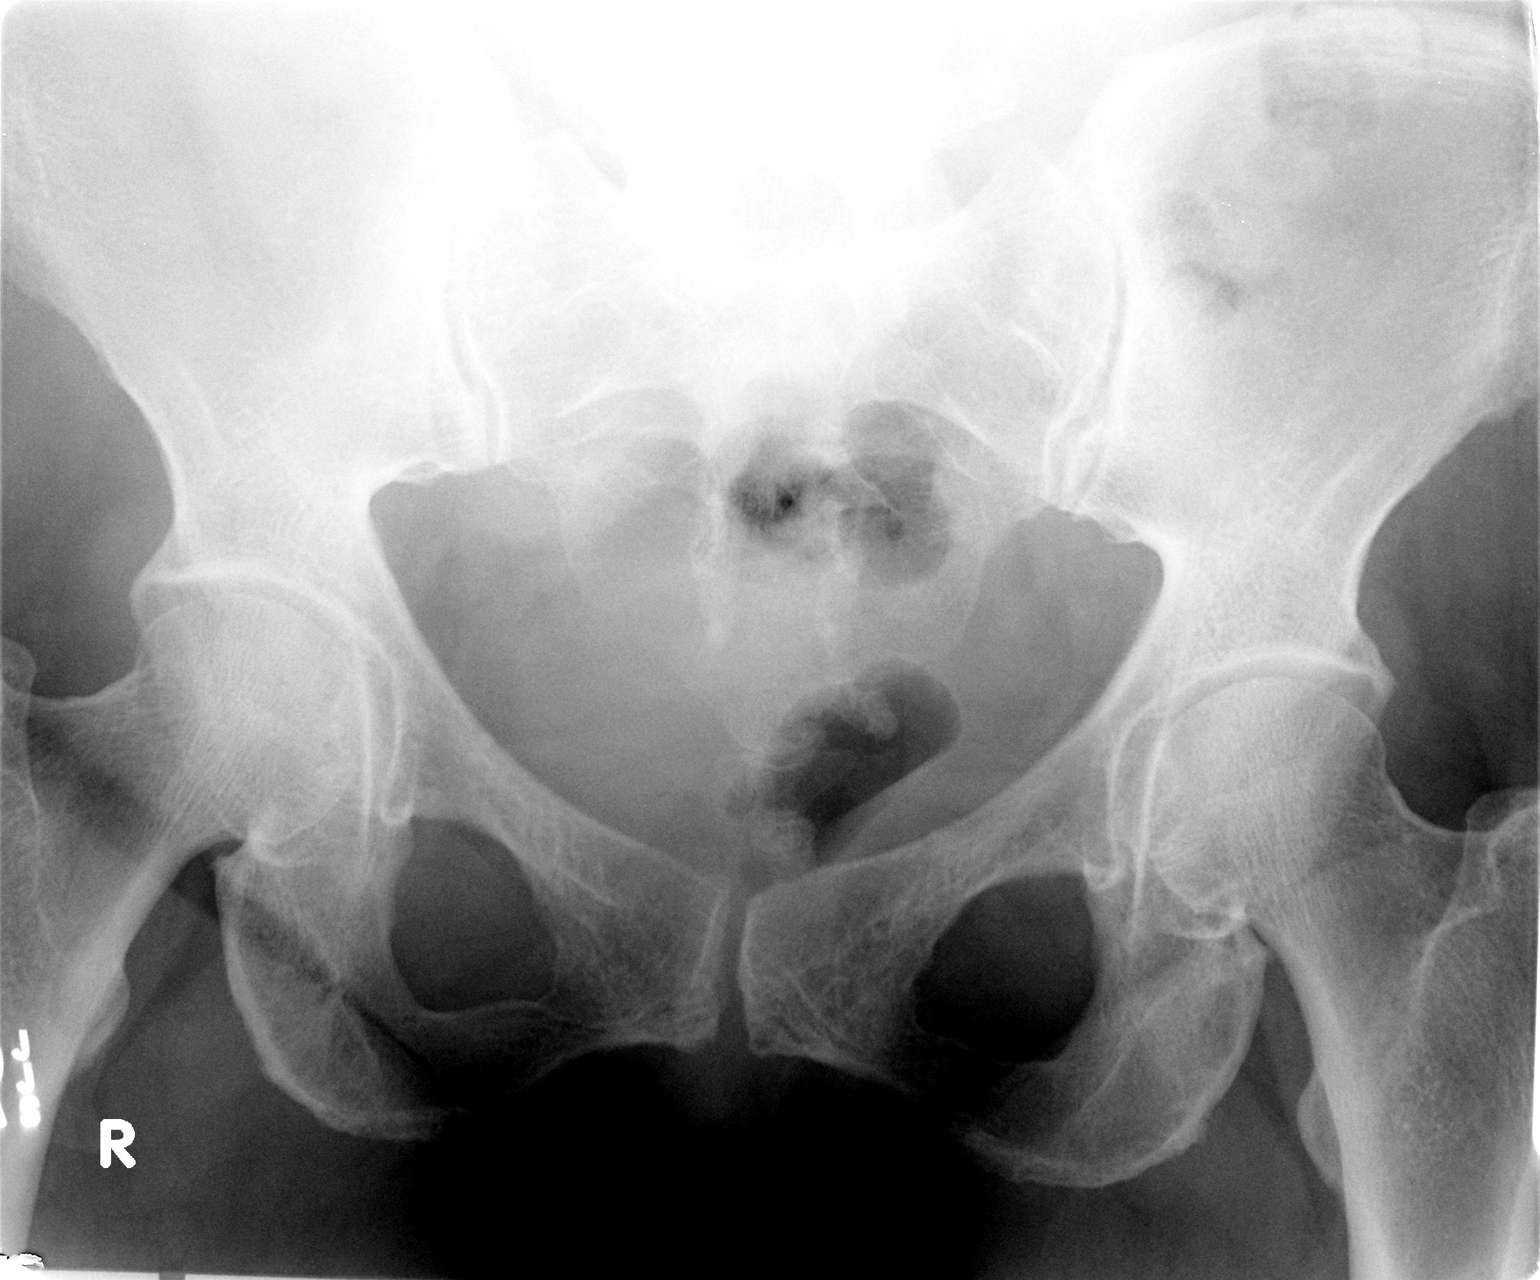

[view not recorded (3 of 3)]
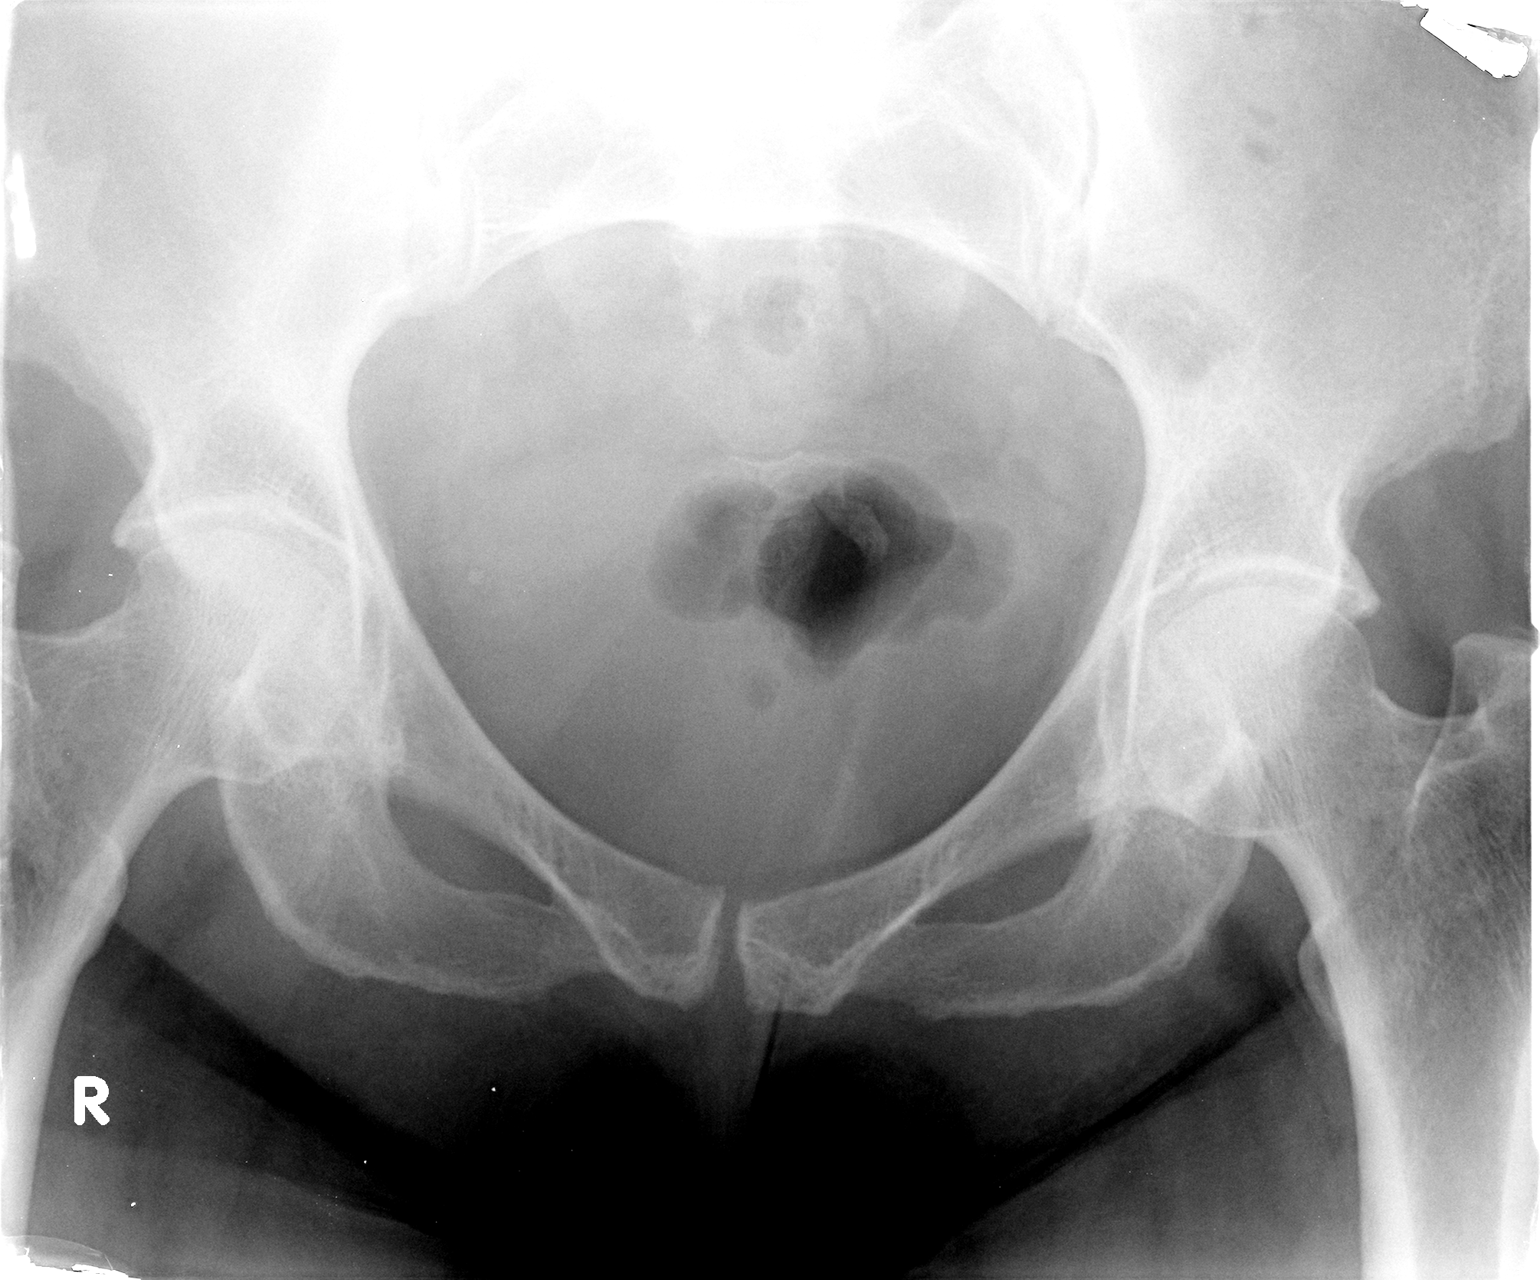

[3 of 3 positions shown; findings below may reference images not displayed]

FINDINGS: The sacrum and coccyx are intact.  No acute bone or soft
tissue abnormalities are present.  Degenerative changes are noted
at L4-5 and L5-S1.
IMPRESSION: 1.  No acute abnormality.
2.  Degenerative changes are present at L4-5 and L5-S1.

## 2013-12-12 ENCOUNTER — Telehealth: Payer: Self-pay | Admitting: Internal Medicine

## 2013-12-12 NOTE — Telephone Encounter (Signed)
Pt wants a referral to [facility] to see a dr for her back and fall, That's all she would tell me.  If you need more info please call her at [phone]

## 2013-12-14 NOTE — Telephone Encounter (Signed)
Needs OV for same - I recommend Dr Tamala Julian thanks

## 2014-01-24 ENCOUNTER — Encounter: Payer: Medicare Other | Admitting: Family

## 2014-05-31 LAB — HM MAMMOGRAPHY

## 2014-06-06 ENCOUNTER — Encounter: Payer: Self-pay | Admitting: Internal Medicine

## 2014-06-21 ENCOUNTER — Other Ambulatory Visit: Payer: Self-pay | Admitting: Family Medicine

## 2014-06-21 DIAGNOSIS — N289 Disorder of kidney and ureter, unspecified: Secondary | ICD-10-CM

## 2014-06-23 ENCOUNTER — Other Ambulatory Visit: Payer: Self-pay

## 2014-06-23 ENCOUNTER — Ambulatory Visit
Admission: RE | Admit: 2014-06-23 | Discharge: 2014-06-23 | Disposition: A | Discharge status: Home / Self Care | Payer: Medicare (Managed Care) | Source: Ambulatory Visit | Attending: Family Medicine | Admitting: Family Medicine

## 2014-06-23 DIAGNOSIS — N289 Disorder of kidney and ureter, unspecified: Secondary | ICD-10-CM

## 2014-08-26 ENCOUNTER — Other Ambulatory Visit: Payer: Self-pay | Admitting: Neurosurgery

## 2014-08-26 DIAGNOSIS — M48061 Spinal stenosis, lumbar region without neurogenic claudication: Secondary | ICD-10-CM

## 2014-09-01 ENCOUNTER — Ambulatory Visit
Admission: RE | Admit: 2014-09-01 | Discharge: 2014-09-01 | Disposition: A | Discharge status: Home / Self Care | Payer: Medicare (Managed Care) | Source: Ambulatory Visit | Attending: Neurosurgery | Admitting: Neurosurgery

## 2014-09-01 DIAGNOSIS — M48061 Spinal stenosis, lumbar region without neurogenic claudication: Secondary | ICD-10-CM

## 2014-09-01 MED ORDER — DIAZEPAM 5 MG PO TABS
5.0000 mg | ORAL_TABLET | Freq: Once | ORAL | Status: AC
  Administered 2014-09-01: 5 mg via ORAL

## 2014-09-01 MED ORDER — IOHEXOL 300 MG/ML  SOLN
9.0000 mL | Freq: Once | INTRAMUSCULAR | Status: AC | PRN
  Administered 2014-09-01: 9 mL via INTRATHECAL

## 2014-09-01 MED ORDER — MEPERIDINE HCL 100 MG/ML IJ SOLN
75.0000 mg | Freq: Once | INTRAMUSCULAR | Status: AC
  Administered 2014-09-01: 75 mg via INTRAMUSCULAR

## 2014-09-01 MED ORDER — ONDANSETRON HCL 4 MG/2ML IJ SOLN
4.0000 mg | Freq: Once | INTRAMUSCULAR | Status: AC
  Administered 2014-09-01: 4 mg via INTRAMUSCULAR

## 2014-09-01 NOTE — Discharge Instructions (Signed)

## 2014-09-21 ENCOUNTER — Other Ambulatory Visit: Payer: Self-pay | Admitting: *Deleted

## 2014-12-19 ENCOUNTER — Emergency Department (INDEPENDENT_AMBULATORY_CARE_PROVIDER_SITE_OTHER)
Admission: EM | Admit: 2014-12-19 | Discharge: 2014-12-19 | Disposition: A | Discharge status: Home / Self Care | Payer: Medicare Other | Source: Home / Self Care

## 2014-12-19 ENCOUNTER — Encounter (HOSPITAL_COMMUNITY): Payer: Self-pay

## 2014-12-19 ENCOUNTER — Emergency Department (INDEPENDENT_AMBULATORY_CARE_PROVIDER_SITE_OTHER): Payer: Medicare Other

## 2014-12-19 DIAGNOSIS — S63259A Unspecified dislocation of unspecified finger, initial encounter: Secondary | ICD-10-CM

## 2014-12-19 MED ORDER — LIDOCAINE HCL (PF) 1 % IJ SOLN
INTRAMUSCULAR | Status: AC
  Filled 2014-12-19: qty 30

## 2014-12-19 NOTE — Discharge Instructions (Signed)
Finger Dislocation  A finger dislocation happens when your finger bones separate from where they connect with each other. It usually happens to the joint closest to your knuckle (proximal interphalangeal joint). Your doctor will put your bones back in the joint. This may be done by pulling on your finger or through surgery. A bandage (dressing) or splint will be placed around your joint. The bandage or splint holds your finger in place while it heals. HOME CARE   Rest your injured joint. Do not move it until told to do so.  Put ice on your injured joint as told by your doctor.  Put ice in a plastic bag.  Place a towel between your skin and the bag.  Leave the ice on for 15-20 minutes at a time. Do this every 2 hours while you are awake.  Raise (elevate) your hand above your heart as told by your doctor.  Only take medicines as told by your doctor. GET HELP RIGHT AWAY IF:  Your bandage or splint becomes damaged.  Your pain becomes worse, not better.  You lose feeling in your finger or it becomes cold or white. MAKE SURE YOU:  Understand these instructions.  Will watch your condition.  Will get help right away if you are not doing well or get worse.   This information is not intended to replace advice given to you by your health care provider. Make sure you discuss any questions you have with your health care provider.   Document Released: 01/02/2011 Document Revised: 02/03/2014 Document Reviewed: 06/09/2014 Elsevier Interactive Patient Education [employer].

## 2014-12-19 NOTE — ED Notes (Signed)
Patient states was leaving work in a hurry and accidentally jammed her Middle finger on her right hand Finger is stuck in the bent position

## 2014-12-19 NOTE — ED Provider Notes (Addendum)
CSN: [id number]     Arrival date & time 12/19/14  1949 History   None    Chief Complaint  Patient presents with  . Hand Pain   (Consider location/radiation/quality/duration/timing/severity/associated sxs/prior Treatment) HPI History obtained from patient:   LOCATION:right hand SEVERITY:6 DURATION:less than one hour CONTEXT:jammed hand into time clock QUALITY:ache MODIFYING FACTORS:none ASSOCIATED SYMPTOMS: unable to extend TIMING:constant OCCUPATION:retired/part-time cleaner  Past Medical History  Diagnosis Date  . OBESITY     Starting weight 220 lb in 2011, then 40lb intentional weight loss, lowest 179  . DEPRESSION   . GERD   . COLONIC POLYPS, HX OF   . Anxiety    Past Surgical History  Procedure Laterality Date  . Tubal ligation    . Carpal tunnel release    . Colonoscopy with polypectomy  2007    hyperplastic   Family History  Problem Relation Age of Onset  . Hypertension Mother   . Breast cancer Mother 22  . Hypertension Father   . Diabetes Father    Social History  Substance Use Topics  . Smoking status: Never Smoker   . Smokeless tobacco: None     Comment: Divorced-lives with 69 yo g-dtr. retired Quarry manager, working at [employer] since 2011  . Alcohol Use: No   OB History    No data available     Review of Systems ROS +'ve right hand injury  Denies: HEADACHE, NAUSEA, ABDOMINAL PAIN, CHEST PAIN, CONGESTION, DYSURIA, SHORTNESS OF BREATH  Allergies  Codeine and Penicillins  Home Medications   Prior to Admission medications   Medication Sig Start Date End Date Taking? Authorizing Provider  ALPRAZolam (XANAX) 0.25 MG tablet Take 1 tablet (0.25 mg total) by mouth 2 (two) times daily as needed for anxiety. 06/10/13   Hendricks Limes, MD  HYDROcodone-acetaminophen (NORCO) 5-325 MG per tablet Take 1 tablet by mouth every 6 (six) hours as needed. 06/10/13   Hendricks Limes, MD  mometasone (ELOCON) 0.1 % cream APPLY TO AFFECTED AREA AS NEEDED  06/10/13   Hendricks Limes, MD  traMADol (ULTRAM) 50 MG tablet Take 1 tablet (50 mg total) by mouth every 8 (eight) hours as needed. 06/10/13   Hendricks Limes, MD   Meds Ordered and Administered this Visit  Medications - No data to display  There were no vitals taken for this visit. No data found.   Physical Exam  Constitutional: She appears well-developed and well-nourished.  Musculoskeletal: She exhibits tenderness.       Right hand: She exhibits decreased range of motion, tenderness, bony tenderness and deformity. She exhibits normal capillary refill, no laceration and no swelling. Normal sensation noted.       Hands: Nursing note and vitals reviewed.   ED Course  Reduction of dislocation Date/Time: 12/19/2014 8:46 PM Performed by: Konrad Felix Authorized by: Linde Gillis C Consent: Verbal consent obtained. Risks and benefits: risks, benefits and alternatives were discussed Consent given by: patient Patient identity confirmed: verbally with patient and arm band Local anesthesia used: yes Anesthesia: digital block Local anesthetic: lidocaine 2% without epinephrine Anesthetic total: 3 ml Patient sedated: no Patient tolerance: Patient tolerated the procedure well with no immediate complications Comments: Patient now has full movement after reduction. Neurovascular integrity is intact.   (including critical care time)  Labs Review Labs Reviewed - No data to display  Imaging Review No results found.   Visual Acuity Review  Right Eye Distance:   Left Eye Distance:   Bilateral Distance:  Right Eye Near:   Left Eye Near:    Bilateral Near:         MDM   1. Finger dislocation, initial encounter      Independent review of right hand xray: Anterior dislocation of the third metacarpal.  Static finger splint is applied. Sensory motor function are intact after reduction of dislocation. Tylenol ibuprofen for discomfort. Cold compresses. She is advised to  return to the urgent care center if there are no worsening of symptoms instructions of care provided discharged home in stable condition.     Konrad Felix, PA 12/19/14 2050  Konrad Felix, [place] 12/27/14 [phone]

## 2015-01-12 ENCOUNTER — Encounter: Payer: Self-pay | Admitting: Gastroenterology

## 2015-03-23 ENCOUNTER — Ambulatory Visit: Payer: Medicare (Managed Care) | Admitting: Gastroenterology

## 2015-04-13 ENCOUNTER — Encounter: Payer: Self-pay | Admitting: Gastroenterology

## 2015-04-20 ENCOUNTER — Encounter: Payer: Medicare (Managed Care) | Admitting: Internal Medicine

## 2015-05-29 ENCOUNTER — Encounter: Payer: Self-pay | Admitting: Gastroenterology

## 2015-06-05 LAB — HM MAMMOGRAPHY

## 2015-06-07 ENCOUNTER — Encounter: Payer: Self-pay | Admitting: Geriatric Medicine

## 2015-06-22 ENCOUNTER — Encounter: Payer: Medicare Other | Admitting: Internal Medicine

## 2015-06-22 DIAGNOSIS — Z0289 Encounter for other administrative examinations: Secondary | ICD-10-CM

## 2015-07-16 ENCOUNTER — Other Ambulatory Visit (INDEPENDENT_AMBULATORY_CARE_PROVIDER_SITE_OTHER): Payer: Medicare Other

## 2015-07-16 ENCOUNTER — Ambulatory Visit (INDEPENDENT_AMBULATORY_CARE_PROVIDER_SITE_OTHER): Payer: Medicare Other | Admitting: Internal Medicine

## 2015-07-16 ENCOUNTER — Encounter: Payer: Self-pay | Admitting: Internal Medicine

## 2015-07-16 VITALS — BP 136/80 | HR 59 | Temp 98.7°F | Resp 18 | Ht 66.0 in | Wt 207.1 lb

## 2015-07-16 DIAGNOSIS — R7301 Impaired fasting glucose: Secondary | ICD-10-CM

## 2015-07-16 DIAGNOSIS — E669 Obesity, unspecified: Secondary | ICD-10-CM | POA: Diagnosis not present

## 2015-07-16 DIAGNOSIS — Z1159 Encounter for screening for other viral diseases: Secondary | ICD-10-CM

## 2015-07-16 DIAGNOSIS — Z860100 Personal history of colon polyps, unspecified: Secondary | ICD-10-CM

## 2015-07-16 DIAGNOSIS — Z0000 Encounter for general adult medical examination without abnormal findings: Secondary | ICD-10-CM | POA: Diagnosis not present

## 2015-07-16 DIAGNOSIS — E785 Hyperlipidemia, unspecified: Secondary | ICD-10-CM | POA: Diagnosis not present

## 2015-07-16 DIAGNOSIS — M1711 Unilateral primary osteoarthritis, right knee: Secondary | ICD-10-CM

## 2015-07-16 DIAGNOSIS — K219 Gastro-esophageal reflux disease without esophagitis: Secondary | ICD-10-CM

## 2015-07-16 DIAGNOSIS — Z23 Encounter for immunization: Secondary | ICD-10-CM

## 2015-07-16 LAB — COMPREHENSIVE METABOLIC PANEL
ALT: 19 U/L (ref 0–35)
AST: 19 U/L (ref 0–37)
Albumin: 4.2 g/dL (ref 3.5–5.2)
Alkaline Phosphatase: 68 U/L (ref 39–117)
BUN: 14 mg/dL (ref 6–23)
CO2: 27 mEq/L (ref 19–32)
Calcium: 9.2 mg/dL (ref 8.4–10.5)
Chloride: 105 mEq/L (ref 96–112)
Creatinine, Ser: 0.57 mg/dL (ref 0.40–1.20)
GFR: 134.97 mL/min (ref >60.00)
Glucose, Bld: 96 mg/dL (ref 70–99)
Potassium: 4 mEq/L (ref 3.5–5.1)
Sodium: 142 mEq/L (ref 135–145)
Total Bilirubin: 0.3 mg/dL (ref 0.2–1.2)
Total Protein: 6.9 g/dL (ref 6.0–8.3)

## 2015-07-16 LAB — LIPID PANEL
Cholesterol: 218 mg/dL — ABNORMAL HIGH (ref 0–200)
HDL: 60.2 mg/dL (ref >39.00)
LDL Cholesterol: 141 mg/dL — ABNORMAL HIGH (ref 0–99)
NonHDL: 157.83
Total CHOL/HDL Ratio: 4
Triglycerides: 86 mg/dL (ref 0.0–149.0)
VLDL: 17.2 mg/dL (ref 0.0–40.0)

## 2015-07-16 LAB — HEPATITIS C ANTIBODY: HCV Ab: NEGATIVE

## 2015-07-16 LAB — HEMOGLOBIN A1C: Hgb A1c MFr Bld: 5.7 % (ref 4.6–6.5)

## 2015-07-16 MED ORDER — ALPRAZOLAM 0.25 MG PO TABS: 0.2500 mg | ORAL_TABLET | Freq: Two times a day (BID) | ORAL | Status: DC | PRN

## 2015-07-16 MED ORDER — TRAMADOL HCL 50 MG PO TABS: 50.0000 mg | ORAL_TABLET | Freq: Three times a day (TID) | ORAL | Status: DC | PRN

## 2015-07-16 MED ORDER — HYDROCODONE-ACETAMINOPHEN 5-325 MG PO TABS: 1.0000 | ORAL_TABLET | Freq: Four times a day (QID) | ORAL | Status: DC | PRN

## 2015-07-16 MED ORDER — PREDNISONE 20 MG PO TABS: 40.0000 mg | ORAL_TABLET | Freq: Every day | ORAL | Status: DC

## 2015-07-16 MED ORDER — MOMETASONE FUROATE 0.1 % EX CREA: TOPICAL_CREAM | CUTANEOUS | Status: DC

## 2015-07-16 MED ORDER — IBUPROFEN 800 MG PO TABS: 800.0000 mg | ORAL_TABLET | Freq: Three times a day (TID) | ORAL | Status: DC | PRN

## 2015-07-16 NOTE — Progress Notes (Signed)
   Subjective:    Patient ID: Patricia Ramos, female    DOB: 05-10-1945, 70 y.o.   MRN: [id number]  HPI Here for medicare wellness, no new complaints. Please see A/P for status and treatment of chronic medical problems. Works 3 jobs.  Diet: heart healthy Physical activity: sedentary Depression/mood screen: negative Hearing: intact to whispered voice Visual acuity: grossly normal with lens, performs annual eye exam  ADLs: capable Fall risk: low Home safety: good Cognitive evaluation: intact to orientation, naming, recall and repetition EOL planning: adv directives discussed, not in place  I have personally reviewed and have noted 1. The patient's medical and social history - reviewed today no changes 2. Their use of alcohol, tobacco or illicit drugs 3. Their current medications and supplements 4. The patient's functional ability including ADL's, fall risks, home safety risks and hearing or visual impairment. 5. Diet and physical activities 6. Evidence for depression or mood disorders 7. Care team reviewed and updated (available in snapshot)  Review of Systems  Constitutional: Negative for fever, activity change, appetite change, fatigue and unexpected weight change.  HENT: Negative.   Eyes: Negative.   Respiratory: Negative for cough, chest tightness and shortness of breath.   Cardiovascular: Negative for chest pain, palpitations and leg swelling.  Gastrointestinal: Negative for abdominal pain, diarrhea, constipation and abdominal distention.  Musculoskeletal: Positive for arthralgias.  Skin: Negative.   Neurological: Negative.   Psychiatric/Behavioral: Negative.       Objective:   Physical Exam  Constitutional: She is oriented to person, place, and time. She appears well-developed and well-nourished.  HENT:  Head: Normocephalic and atraumatic.  Eyes: EOM are normal.  Neck: Normal range of motion.  Cardiovascular: Normal rate and regular rhythm.   Pulmonary/Chest: Effort  normal and breath sounds normal. No respiratory distress. She has no wheezes.  Abdominal: Soft. Bowel sounds are normal. She exhibits no distension. There is no tenderness.  Musculoskeletal: She exhibits no edema.  Neurological: She is alert and oriented to person, place, and time. Coordination normal.  Skin: Skin is warm and dry.  Psychiatric: She has a normal mood and affect.   Filed Vitals:   07/16/15 0829  BP: 136/80  Pulse: 59  Temp: 98.7 F (37.1 C)  TempSrc: Oral  Resp: 18  Height: 5\' 6"  (1.676 m)  Weight: 207 lb 1.9 oz (93.949 kg)  SpO2: 98%      Assessment & Plan:  Prevnar 13 given at visit.

## 2015-07-16 NOTE — Progress Notes (Signed)
Pre visit review using our clinic review tool, if applicable. No additional management support is needed unless otherwise documented below in the visit note. 

## 2015-07-16 NOTE — Assessment & Plan Note (Signed)
Already scheduled for colonoscopy later this year.

## 2015-07-16 NOTE — Assessment & Plan Note (Signed)
Zantac resolves symptoms.

## 2015-07-16 NOTE — Patient Instructions (Signed)
We have refilled the medicines that you can take to the pharmacy.   We have sent in the cream and sent in prednisone pills for the poison ivy. Take 2 pills a day for 5 days then stop.   Health Maintenance, Female Adopting a healthy lifestyle and getting preventive care can go a long way to promote health and wellness. Talk with your health care provider about what schedule of regular examinations is right for you. This is a good chance for you to check in with your provider about disease prevention and staying healthy. In between checkups, there are plenty of things you can do on your own. Experts have done a lot of research about which lifestyle changes and preventive measures are most likely to keep you healthy. Ask your health care provider for more information. WEIGHT AND DIET  Eat a healthy diet  Be sure to include plenty of vegetables, fruits, low-fat dairy products, and lean protein.  Do not eat a lot of foods high in solid fats, added sugars, or salt.  Get regular exercise. This is one of the most important things you can do for your health.  Most adults should exercise for at least 150 minutes each week. The exercise should increase your heart rate and make you sweat (moderate-intensity exercise).  Most adults should also do strengthening exercises at least twice a week. This is in addition to the moderate-intensity exercise.  Maintain a healthy weight  Body mass index (BMI) is a measurement that can be used to identify possible weight problems. It estimates body fat based on height and weight. Your health care provider can help determine your BMI and help you achieve or maintain a healthy weight.  For females 40 years of age and older:   A BMI below 18.5 is considered underweight.  A BMI of 18.5 to 24.9 is normal.  A BMI of 25 to 29.9 is considered overweight.  A BMI of 30 and above is considered obese.  Watch levels of cholesterol and blood lipids  You should start  having your blood tested for lipids and cholesterol at 70 years of age, then have this test every 5 years.  You may need to have your cholesterol levels checked more often if:  Your lipid or cholesterol levels are high.  You are older than 70 years of age.  You are at high risk for heart disease.  CANCER SCREENING   Lung Cancer  Lung cancer screening is recommended for adults 34-49 years old who are at high risk for lung cancer because of a history of smoking.  A yearly low-dose CT scan of the lungs is recommended for people who:  Currently smoke.  Have quit within the past 15 years.  Have at least a 30-pack-year history of smoking. A pack year is smoking an average of one pack of cigarettes a day for 1 year.  Yearly screening should continue until it has been 15 years since you quit.  Yearly screening should stop if you develop a health problem that would prevent you from having lung cancer treatment.  Breast Cancer  Practice breast self-awareness. This means understanding how your breasts normally appear and feel.  It also means doing regular breast self-exams. Let your health care provider know about any changes, no matter how small.  If you are in your 20s or 30s, you should have a clinical breast exam (CBE) by a health care provider every 1-3 years as part of a regular health exam.  If  you are 40 or older, have a CBE every year. Also consider having a breast X-ray (mammogram) every year.  If you have a family history of breast cancer, talk to your health care provider about genetic screening.  If you are at high risk for breast cancer, talk to your health care provider about having an MRI and a mammogram every year.  Breast cancer gene (BRCA) assessment is recommended for women who have family members with BRCA-related cancers. BRCA-related cancers include:  Breast.  Ovarian.  Tubal.  Peritoneal cancers.  Results of the assessment will determine the need for  genetic counseling and BRCA1 and BRCA2 testing. Cervical Cancer Your health care provider may recommend that you be screened regularly for cancer of the pelvic organs (ovaries, uterus, and vagina). This screening involves a pelvic examination, including checking for microscopic changes to the surface of your cervix (Pap test). You may be encouraged to have this screening done every 3 years, beginning at age 31.  For women ages 20-65, health care providers may recommend pelvic exams and Pap testing every 3 years, or they may recommend the Pap and pelvic exam, combined with testing for human papilloma virus (HPV), every 5 years. Some types of HPV increase your risk of cervical cancer. Testing for HPV may also be done on women of any age with unclear Pap test results.  Other health care providers may not recommend any screening for nonpregnant women who are considered low risk for pelvic cancer and who do not have symptoms. Ask your health care provider if a screening pelvic exam is right for you.  If you have had past treatment for cervical cancer or a condition that could lead to cancer, you need Pap tests and screening for cancer for at least 20 years after your treatment. If Pap tests have been discontinued, your risk factors (such as having a new sexual partner) need to be reassessed to determine if screening should resume. Some women have medical problems that increase the chance of getting cervical cancer. In these cases, your health care provider may recommend more frequent screening and Pap tests. Colorectal Cancer  This type of cancer can be detected and often prevented.  Routine colorectal cancer screening usually begins at 70 years of age and continues through 70 years of age.  Your health care provider may recommend screening at an earlier age if you have risk factors for colon cancer.  Your health care provider may also recommend using home test kits to check for hidden blood in the  stool.  A small camera at the end of a tube can be used to examine your colon directly (sigmoidoscopy or colonoscopy). This is done to check for the earliest forms of colorectal cancer.  Routine screening usually begins at age 76.  Direct examination of the colon should be repeated every 5-10 years through 70 years of age. However, you may need to be screened more often if early forms of precancerous polyps or small growths are found. Skin Cancer  Check your skin from head to toe regularly.  Tell your health care provider about any new moles or changes in moles, especially if there is a change in a mole's shape or color.  Also tell your health care provider if you have a mole that is larger than the size of a pencil eraser.  Always use sunscreen. Apply sunscreen liberally and repeatedly throughout the day.  Protect yourself by wearing long sleeves, pants, a wide-brimmed hat, and sunglasses whenever you are  outside. HEART DISEASE, DIABETES, AND HIGH BLOOD PRESSURE   High blood pressure causes heart disease and increases the risk of stroke. High blood pressure is more likely to develop in:  People who have blood pressure in the high end of the normal range (130-139/85-89 mm Hg).  People who are overweight or obese.  People who are African American.  If you are 18-39 years of age, have your blood pressure checked every 3-5 years. If you are 40 years of age or older, have your blood pressure checked every year. You should have your blood pressure measured twice--once when you are at a hospital or clinic, and once when you are not at a hospital or clinic. Record the average of the two measurements. To check your blood pressure when you are not at a hospital or clinic, you can use:  An automated blood pressure machine at a pharmacy.  A home blood pressure monitor.  If you are between 55 years and 79 years old, ask your health care provider if you should take aspirin to prevent  strokes.  Have regular diabetes screenings. This involves taking a blood sample to check your fasting blood sugar level.  If you are at a normal weight and have a low risk for diabetes, have this test once every three years after 70 years of age.  If you are overweight and have a high risk for diabetes, consider being tested at a younger age or more often. PREVENTING INFECTION  Hepatitis B  If you have a higher risk for hepatitis B, you should be screened for this virus. You are considered at high risk for hepatitis B if:  You were born in a country where hepatitis B is common. Ask your health care provider which countries are considered high risk.  Your parents were born in a high-risk country, and you have not been immunized against hepatitis B (hepatitis B vaccine).  You have HIV or AIDS.  You use needles to inject street drugs.  You live with someone who has hepatitis B.  You have had sex with someone who has hepatitis B.  You get hemodialysis treatment.  You take certain medicines for conditions, including cancer, organ transplantation, and autoimmune conditions. Hepatitis C  Blood testing is recommended for:  Everyone born from 1945 through 1965.  Anyone with known risk factors for hepatitis C. Sexually transmitted infections (STIs)  You should be screened for sexually transmitted infections (STIs) including gonorrhea and chlamydia if:  You are sexually active and are younger than 70 years of age.  You are older than 70 years of age and your health care provider tells you that you are at risk for this type of infection.  Your sexual activity has changed since you were last screened and you are at an increased risk for chlamydia or gonorrhea. Ask your health care provider if you are at risk.  If you do not have HIV, but are at risk, it may be recommended that you take a prescription medicine daily to prevent HIV infection. This is called pre-exposure prophylaxis  (PrEP). You are considered at risk if:  You are sexually active and do not regularly use condoms or know the HIV status of your partner(s).  You take drugs by injection.  You are sexually active with a partner who has HIV. Talk with your health care provider about whether you are at high risk of being infected with HIV. If you choose to begin PrEP, you should first be tested for HIV.   You should then be tested every 3 months for as long as you are taking PrEP.  PREGNANCY   If you are premenopausal and you may become pregnant, ask your health care provider about preconception counseling.  If you may become pregnant, take 400 to 800 micrograms (mcg) of folic acid every day.  If you want to prevent pregnancy, talk to your health care provider about birth control (contraception). OSTEOPOROSIS AND MENOPAUSE   Osteoporosis is a disease in which the bones lose minerals and strength with aging. This can result in serious bone fractures. Your risk for osteoporosis can be identified using a bone density scan.  If you are 36 years of age or older, or if you are at risk for osteoporosis and fractures, ask your health care provider if you should be screened.  Ask your health care provider whether you should take a calcium or vitamin D supplement to lower your risk for osteoporosis.  Menopause may have certain physical symptoms and risks.  Hormone replacement therapy may reduce some of these symptoms and risks. Talk to your health care provider about whether hormone replacement therapy is right for you.  HOME CARE INSTRUCTIONS   Schedule regular health, dental, and eye exams.  Stay current with your immunizations.   Do not use any tobacco products including cigarettes, chewing tobacco, or electronic cigarettes.  If you are pregnant, do not drink alcohol.  If you are breastfeeding, limit how much and how often you drink alcohol.  Limit alcohol intake to no more than 1 drink per day for  nonpregnant women. One drink equals 12 ounces of beer, 5 ounces of wine, or 1 ounces of hard liquor.  Do not use street drugs.  Do not share needles.  Ask your health care provider for help if you need support or information about quitting drugs.  Tell your health care provider if you often feel depressed.  Tell your health care provider if you have ever been abused or do not feel safe at home.   This information is not intended to replace advice given to you by your health care provider. Make sure you discuss any questions you have with your health care provider.   Document Released: 07/29/2010 Document Revised: 02/03/2014 Document Reviewed: 12/15/2012 Elsevier Interactive Patient Education [employer].

## 2015-07-16 NOTE — Assessment & Plan Note (Signed)
Talked to her about the need for more regular exercise.

## 2015-07-16 NOTE — Assessment & Plan Note (Signed)
Checking lipid panel and adjust as needed.  

## 2015-07-16 NOTE — Assessment & Plan Note (Signed)
Checking labs and hep c screening. Getting colonoscopy this year. Given pneumonia shot today then needs another next year. Counseled about the need for regular exercise and diet for weight. Given 10 year screening recommendations.

## 2015-07-16 NOTE — Assessment & Plan Note (Signed)
Ran through [place] narcotic database and fills once per year (last Aug 2016) and okay with refill of tramadol and hydrocodone which she uses rarely and ibuprofen today.

## 2015-07-24 ENCOUNTER — Encounter: Payer: Self-pay | Admitting: Gastroenterology

## 2015-07-24 ENCOUNTER — Ambulatory Visit (AMBULATORY_SURGERY_CENTER): Payer: Self-pay

## 2015-07-24 VITALS — Ht 66.0 in | Wt 206.8 lb

## 2015-07-24 DIAGNOSIS — Z86010 Personal history of colonic polyps: Secondary | ICD-10-CM

## 2015-07-24 MED ORDER — NA SULFATE-K SULFATE-MG SULF 17.5-3.13-1.6 GM/177ML PO SOLN: ORAL | Status: DC

## 2015-07-24 NOTE — Progress Notes (Signed)
Per pt, no allergies to soy or egg products.Pt not taking any weight loss meds or using  O2 at home. 

## 2015-08-07 ENCOUNTER — Encounter: Payer: Medicare Other | Admitting: Gastroenterology

## 2015-08-10 ENCOUNTER — Other Ambulatory Visit: Payer: Self-pay | Admitting: Internal Medicine

## 2015-08-13 ENCOUNTER — Encounter: Payer: Self-pay | Admitting: Gastroenterology

## 2015-08-13 ENCOUNTER — Ambulatory Visit (AMBULATORY_SURGERY_CENTER): Payer: Medicare Other | Admitting: Gastroenterology

## 2015-08-13 VITALS — BP 110/68 | HR 79 | Temp 97.7°F | Resp 21 | Ht 66.0 in | Wt 207.0 lb

## 2015-08-13 DIAGNOSIS — D124 Benign neoplasm of descending colon: Secondary | ICD-10-CM | POA: Diagnosis not present

## 2015-08-13 DIAGNOSIS — Z86010 Personal history of colonic polyps: Secondary | ICD-10-CM

## 2015-08-13 DIAGNOSIS — Z538 Procedure and treatment not carried out for other reasons: Secondary | ICD-10-CM | POA: Diagnosis not present

## 2015-08-13 MED ORDER — SODIUM CHLORIDE 0.9 % IV SOLN: 500.0000 mL | INTRAVENOUS | Status: DC

## 2015-08-13 NOTE — Patient Instructions (Signed)
Discharge instructions given. Handouts on polyps and diverticulosis and hemorrhoids. Resume previous medications. YOU HAD AN ENDOSCOPIC PROCEDURE TODAY AT [facility]:   Refer to the procedure report that was given to you for any specific questions about what was found during the examination.  If the procedure report does not answer your questions, please call your gastroenterologist to clarify.  If you requested that your care partner not be given the details of your procedure findings, then the procedure report has been included in a sealed envelope for you to review at your convenience later.  YOU SHOULD EXPECT: Some feelings of bloating in the abdomen. Passage of more gas than usual.  Walking can help get rid of the air that was put into your GI tract during the procedure and reduce the bloating. If you had a lower endoscopy (such as a colonoscopy or flexible sigmoidoscopy) you may notice spotting of blood in your stool or on the toilet paper. If you underwent a bowel prep for your procedure, you may not have a normal bowel movement for a few days.  Please Note:  You might notice some irritation and congestion in your nose or some drainage.  This is from the oxygen used during your procedure.  There is no need for concern and it should clear up in a day or so.  SYMPTOMS TO REPORT IMMEDIATELY:   Following lower endoscopy (colonoscopy or flexible sigmoidoscopy):  Excessive amounts of blood in the stool  Significant tenderness or worsening of abdominal pains  Swelling of the abdomen that is new, acute  Fever of 100F or higher   For urgent or emergent issues, a gastroenterologist can be reached at any hour by calling [phone].   DIET: Your first meal following the procedure should be a small meal and then it is ok to progress to your normal diet. Heavy or fried foods are harder to digest and may make you feel nauseous or bloated.  Likewise, meals heavy in dairy and  vegetables can increase bloating.  Drink plenty of fluids but you should avoid alcoholic beverages for 24 hours.  ACTIVITY:  You should plan to take it easy for the rest of today and you should NOT DRIVE or use heavy machinery until tomorrow (because of the sedation medicines used during the test).    FOLLOW UP: Our staff will call the number listed on your records the next business day following your procedure to check on you and address any questions or concerns that you may have regarding the information given to you following your procedure. If we do not reach you, we will leave a message.  However, if you are feeling well and you are not experiencing any problems, there is no need to return our call.  We will assume that you have returned to your regular daily activities without incident.  If any biopsies were taken you will be contacted by phone or by letter within the next 1-3 weeks.  Please call us at [phone] if you have not heard about the biopsies in 3 weeks.    SIGNATURES/CONFIDENTIALITY: You and/or your care partner have signed paperwork which will be entered into your electronic medical record.  These signatures attest to the fact that that the information above on your After Visit Summary has been reviewed and is understood.  Full responsibility of the confidentiality of this discharge information lies with you and/or your care-partner.

## 2015-08-13 NOTE — Progress Notes (Signed)
Report given to PACU RN, vss 

## 2015-08-13 NOTE — Progress Notes (Signed)
Called to room to assist during endoscopic procedure.  Patient ID and intended procedure confirmed with present staff. Received instructions for my participation in the procedure from the performing physician.  

## 2015-08-13 NOTE — Progress Notes (Signed)
In recovery room instructions given for repeat colonoscopy on Wednesday at 1330. Sundra Aland went over instructions for procedure.

## 2015-08-13 NOTE — Op Note (Signed)
[facility] Patient Name: Patricia Ramos Procedure Date: 08/13/2015 9:57 AM MRN: [id number] Endoscopist: Mauri Pole , MD Age: 70 Referring MD:  Date of Birth: 08-20-1945 Gender: Female Account #: [id number] Procedure:                Colonoscopy Indications:              Screening for colorectal malignant neoplasm, Last                            colonoscopy 10 years ago Medicines:                Monitored Anesthesia Care Procedure:                Pre-Anesthesia Assessment:                           - Prior to the procedure, a History and Physical                            was performed, and patient medications and                            allergies were reviewed. The patient's tolerance of                            previous anesthesia was also reviewed. The risks                            and benefits of the procedure and the sedation                            options and risks were discussed with the patient.                            All questions were answered, and informed consent                            was obtained. Prior Anticoagulants: The patient has                            taken no previous anticoagulant or antiplatelet                            agents. ASA Grade Assessment: II - A patient with                            mild systemic disease. After reviewing the risks                            and benefits, the patient was deemed in                            satisfactory condition to undergo the procedure.  After obtaining informed consent, the colonoscope                            was passed under direct vision. Throughout the                            procedure, the patient's blood pressure, pulse, and                            oxygen saturations were monitored continuously. The                            Model CF-HQ190L [id number]) scope was introduced                            through the anus and advanced to  the the cecum,                            identified by appendiceal orifice and ileocecal                            valve. The colonoscopy was performed without                            difficulty. The patient tolerated the procedure                            well. The quality of the bowel preparation was                            inadequate, 30 percent obscured and good except the                            ascending colon was poor. The ileocecal valve,                            appendiceal orifice, and rectum were photographed. Scope In: 10:01:05 AM Scope Out: 10:11:10 AM Scope Withdrawal Time: 0 hours 7 minutes 28 seconds  Total Procedure Duration: 0 hours 10 minutes 5 seconds  Findings:                 The perianal and digital rectal examinations were                            normal.                           A 6 mm polyp was found in the descending colon. The                            polyp was sessile. The polyp was removed with a                            cold snare. Resection and retrieval were complete.  A 3 mm polyp was found in the descending colon. The                            polyp was sessile. The polyp was removed with a                            cold biopsy forceps. Resection and retrieval were                            complete.                           A few small-mouthed diverticula were found in the                            sigmoid colon.                           Non-bleeding internal hemorrhoids were found during                            retroflexion. The hemorrhoids were small. Complications:            No immediate complications. Estimated Blood Loss:     Estimated blood loss was minimal. Impression:               - Preparation of the colon was inadequate.                           - One 6 mm polyp in the descending colon, removed                            with a cold snare. Resected and retrieved.                            - One 3 mm polyp in the descending colon, removed                            with a cold biopsy forceps. Resected and retrieved.                           - Diverticulosis in the sigmoid colon.                           - Non-bleeding internal hemorrhoids. Recommendation:           - Patient has a contact number available for                            emergencies. The signs and symptoms of potential                            delayed complications were discussed with the  patient. Return to normal activities tomorrow.                            Written discharge instructions were provided to the                            patient.                           - Resume previous diet.                           - Continue present medications.                           - Await pathology results.                           - Repeat colonoscopy in 1 year because the bowel                            preparation was suboptimal.                           - For future colonoscopy the patient will require                            an extended preparation. If there are any                            questions, please contact the gastroenterologist. Mauri Pole, MD 08/13/2015 10:17:21 AM This report has been signed electronically.

## 2015-08-14 ENCOUNTER — Telehealth: Payer: Self-pay

## 2015-08-14 NOTE — Telephone Encounter (Signed)
  Follow up Call-  Call back number 08/13/2015  Post procedure Call Back phone  # [phone] cell  Permission to leave phone message Yes     Patient questions:  Do you have a fever, pain , or abdominal swelling? No. Pain Score  0 *  Have you tolerated food without any problems? Yes.    Have you been able to return to your normal activities? Yes.    Do you have any questions about your discharge instructions: Diet   No. Medications  No. Follow up visit  No.  Do you have questions or concerns about your Care? No.  Actions: * If pain score is 4 or above: No action needed, pain <4.  Pt reported she ate a salad last night, but will follow the instructions today.  I advised her to read over the instructions this am, and to push clear liquids all day.  Pt to call us back if any questions or concerns. maw

## 2015-08-15 ENCOUNTER — Ambulatory Visit (AMBULATORY_SURGERY_CENTER): Payer: Medicare Other | Admitting: Gastroenterology

## 2015-08-15 ENCOUNTER — Encounter: Payer: Self-pay | Admitting: Gastroenterology

## 2015-08-15 VITALS — BP 138/75 | HR 67 | Temp 97.8°F | Resp 18 | Ht 66.0 in | Wt 206.0 lb

## 2015-08-15 DIAGNOSIS — Z86010 Personal history of colonic polyps: Secondary | ICD-10-CM | POA: Diagnosis not present

## 2015-08-15 DIAGNOSIS — K635 Polyp of colon: Secondary | ICD-10-CM

## 2015-08-15 DIAGNOSIS — D125 Benign neoplasm of sigmoid colon: Secondary | ICD-10-CM

## 2015-08-15 LAB — GLUCOSE, CAPILLARY: Glucose-Capillary: 76 mg/dL (ref 65–99)

## 2015-08-15 MED ORDER — SODIUM CHLORIDE 0.9 % IV SOLN: 500.0000 mL | INTRAVENOUS | Status: DC

## 2015-08-15 NOTE — Op Note (Signed)
[place] Patient Name: Patricia Ramos Procedure Date: 08/15/2015 12:52 PM MRN: [id number] Endoscopist: Mauri Pole , MD Age: 70 Referring MD:  Date of Birth: 1945/04/28 Gender: Female Account #: [id number] Procedure:                Colonoscopy Indications:              Screening for malignant neoplasm in the colon Medicines:                Monitored Anesthesia Care Procedure:                Pre-Anesthesia Assessment:                           - Prior to the procedure, a History and Physical                            was performed, and patient medications and                            allergies were reviewed. The patient's tolerance of                            previous anesthesia was also reviewed. The risks                            and benefits of the procedure and the sedation                            options and risks were discussed with the patient.                            All questions were answered, and informed consent                            was obtained. Prior Anticoagulants: The patient has                            taken no previous anticoagulant or antiplatelet                            agents. ASA Grade Assessment: III - A patient with                            severe systemic disease. After reviewing the risks                            and benefits, the patient was deemed in                            satisfactory condition to undergo the procedure.                           After obtaining informed consent, the colonoscope  was passed under direct vision. Throughout the                            procedure, the patient's blood pressure, pulse, and                            oxygen saturations were monitored continuously. The                            Model CF-HQ190L [id number]) scope was introduced                            through the anus and advanced to the the terminal                            ileum,  with identification of the appendiceal                            orifice and IC valve. The colonoscopy was performed                            without difficulty. The patient tolerated the                            procedure well. The quality of the bowel                            preparation was excellent. The terminal ileum,                            ileocecal valve, appendiceal orifice, and rectum                            were photographed. Scope In: 1:41:55 PM Scope Out: 1:54:24 PM Scope Withdrawal Time: 0 hours 9 minutes 46 seconds  Total Procedure Duration: 0 hours 12 minutes 29 seconds  Findings:                 The perianal and digital rectal examinations were                            normal.                           A 4 mm polyp was found in the sigmoid colon. The                            polyp was sessile. The polyp was removed with a                            cold snare. Resection and retrieval were complete.                           A few small-mouthed diverticula were found in the  sigmoid colon.                           Non-bleeding internal hemorrhoids were found during                            retroflexion. The hemorrhoids were small.                           The exam was otherwise without abnormality. Complications:            No immediate complications. Estimated Blood Loss:     Estimated blood loss: none. Estimated blood loss                            was minimal. Impression:               - One 4 mm polyp in the sigmoid colon, removed with                            a cold snare. Resected and retrieved.                           - Diverticulosis in the sigmoid colon.                           - Non-bleeding internal hemorrhoids.                           - The examination was otherwise normal. Recommendation:           - Patient has a contact number available for                            emergencies. The signs and  symptoms of potential                            delayed complications were discussed with the                            patient. Return to normal activities tomorrow.                            Written discharge instructions were provided to the                            patient.                           - Resume previous diet.                           - Continue present medications.                           - Await pathology results.                           -  Repeat colonoscopy in 5-10 years for surveillance                            based on pathology results. Mauri Pole, MD 08/15/2015 2:01:10 PM This report has been signed electronically.

## 2015-08-15 NOTE — Patient Instructions (Addendum)
YOU HAD AN ENDOSCOPIC PROCEDURE TODAY AT THE [facility]:   Refer to the procedure report that was given to you for any specific questions about what was found during the examination.  If the procedure report does not answer your questions, please call your gastroenterologist to clarify.  If you requested that your care partner not be given the details of your procedure findings, then the procedure report has been included in a sealed envelope for you to review at your convenience later.  YOU SHOULD EXPECT: Some feelings of bloating in the abdomen. Passage of more gas than usual.  Walking can help get rid of the air that was put into your GI tract during the procedure and reduce the bloating. If you had a lower endoscopy (such as a colonoscopy or flexible sigmoidoscopy) you may notice spotting of blood in your stool or on the toilet paper. If you underwent a bowel prep for your procedure, you may not have a normal bowel movement for a few days.  Please Note:  You might notice some irritation and congestion in your nose or some drainage.  This is from the oxygen used during your procedure.  There is no need for concern and it should clear up in a day or so.  SYMPTOMS TO REPORT IMMEDIATELY:   Following lower endoscopy (colonoscopy or flexible sigmoidoscopy):  Excessive amounts of blood in the stool  Significant tenderness or worsening of abdominal pains  Swelling of the abdomen that is new, acute  Fever of 100F or higher   Following upper endoscopy (EGD)  Vomiting of blood or coffee ground material  New chest pain or pain under the shoulder blades  Painful or persistently difficult swallowing  New shortness of breath  Fever of 100F or higher  Black, tarry-looking stools  For urgent or emergent issues, a gastroenterologist can be reached at any hour by calling [phone].   DIET: Your first meal following the procedure should be a small meal and then it is ok to progress to  your normal diet. Heavy or fried foods are harder to digest and may make you feel nauseous or bloated.  Likewise, meals heavy in dairy and vegetables can increase bloating.  Drink plenty of fluids but you should avoid alcoholic beverages for 24 hours.  ACTIVITY:  You should plan to take it easy for the rest of today and you should NOT DRIVE or use heavy machinery until tomorrow (because of the sedation medicines used during the test).    FOLLOW UP: Our staff will call the number listed on your records the next business day following your procedure to check on you and address any questions or concerns that you may have regarding the information given to you following your procedure. If we do not reach you, we will leave a message.  However, if you are feeling well and you are not experiencing any problems, there is no need to return our call.  We will assume that you have returned to your regular daily activities without incident.  If any biopsies were taken you will be contacted by phone or by letter within the next 1-3 weeks.  Please call us at [phone] if you have not heard about the biopsies in 3 weeks.    SIGNATURES/CONFIDENTIALITY: You and/or your care partner have signed paperwork which will be entered into your electronic medical record.  These signatures attest to the fact that that the information above on your After Visit Summary has been reviewed   and is understood.  Full responsibility of the confidentiality of this discharge information lies with you and/or your care-partner.YOU HAD AN ENDOSCOPIC PROCEDURE TODAY AT [facility]:   Refer to the procedure report that was given to you for any specific questions about what was found during the examination.  If the procedure report does not answer your questions, please call your gastroenterologist to clarify.  If you requested that your care partner not be given the details of your procedure findings, then the procedure  report has been included in a sealed envelope for you to review at your convenience later.  YOU SHOULD EXPECT: Some feelings of bloating in the abdomen. Passage of more gas than usual.  Walking can help get rid of the air that was put into your GI tract during the procedure and reduce the bloating. If you had a lower endoscopy (such as a colonoscopy or flexible sigmoidoscopy) you may notice spotting of blood in your stool or on the toilet paper. If you underwent a bowel prep for your procedure, you may not have a normal bowel movement for a few days.  Please Note:  You might notice some irritation and congestion in your nose or some drainage.  This is from the oxygen used during your procedure.  There is no need for concern and it should clear up in a day or so.  SYMPTOMS TO REPORT IMMEDIATELY:   Following lower endoscopy (colonoscopy or flexible sigmoidoscopy):  Excessive amounts of blood in the stool  Significant tenderness or worsening of abdominal pains  Swelling of the abdomen that is new, acute  Fever of 100F or higher   For urgent or emergent issues, a gastroenterologist can be reached at any hour by calling [phone].   DIET: Your first meal following the procedure should be a small meal and then it is ok to progress to your normal diet. Heavy or fried foods are harder to digest and may make you feel nauseous or bloated.  Likewise, meals heavy in dairy and vegetables can increase bloating.  Drink plenty of fluids but you should avoid alcoholic beverages for 24 hours.  ACTIVITY:  You should plan to take it easy for the rest of today and you should NOT DRIVE or use heavy machinery until tomorrow (because of the sedation medicines used during the test).    FOLLOW UP: Our staff will call the number listed on your records the next business day following your procedure to check on you and address any questions or concerns that you may have regarding the information given to you  following your procedure. If we do not reach you, we will leave a message.  However, if you are feeling well and you are not experiencing any problems, there is no need to return our call.  We will assume that you have returned to your regular daily activities without incident.  If any biopsies were taken you will be contacted by phone or by letter within the next 1-3 weeks.  Please call us at [phone] if you have not heard about the biopsies in 3 weeks.    SIGNATURES/CONFIDENTIALITY: You and/or your care partner have signed paperwork which will be entered into your electronic medical record.  These signatures attest to the fact that that the information above on your After Visit Summary has been reviewed and is understood.  Full responsibility of the confidentiality of this discharge information lies with you and/or your care-partner.  Polyp, diverticulosis, and hemorrhoid information  given.

## 2015-08-15 NOTE — Progress Notes (Signed)
Called to room to assist during endoscopic procedure.  Patient ID and intended procedure confirmed with present staff. Received instructions for my participation in the procedure from the performing physician.  

## 2015-08-15 NOTE — Progress Notes (Signed)
Blood sugar taken by Nancy Nordmann per Dr. Woodward Ku request. Reading of 76 obtained.

## 2015-08-15 NOTE — Progress Notes (Signed)
Stable to RR 

## 2015-08-15 NOTE — Progress Notes (Signed)
Pt emotional pre procedure VSS, O2 on reassured. BS checked by [place] Dragon which was 37. Dr requested to proceed. IV infusing slowly.

## 2015-08-16 ENCOUNTER — Telehealth: Payer: Self-pay

## 2015-08-16 NOTE — Telephone Encounter (Signed)
  Follow up Call-  Call back number 08/15/2015 08/13/2015  Post procedure Call Back phone  # [phone] cell  Permission to leave phone message Yes Yes     Patient questions:  Do you have a fever, pain , or abdominal swelling? No. Pain Score  0 *  Have you tolerated food without any problems? Yes.    Have you been able to return to your normal activities? Yes.    Do you have any questions about your discharge instructions: Diet   No. Medications  No. Follow up visit  No.  Do you have questions or concerns about your Care? No.  Actions: * If pain score is 4 or above: No action needed, pain <4.

## 2015-08-21 ENCOUNTER — Encounter: Payer: Self-pay | Admitting: Gastroenterology

## 2015-10-02 ENCOUNTER — Other Ambulatory Visit: Payer: Self-pay | Admitting: Internal Medicine

## 2015-10-16 DIAGNOSIS — R7301 Impaired fasting glucose: Secondary | ICD-10-CM | POA: Insufficient documentation

## 2016-02-23 ENCOUNTER — Encounter: Payer: Self-pay | Admitting: Student

## 2016-03-12 ENCOUNTER — Other Ambulatory Visit: Payer: Self-pay | Admitting: Internal Medicine

## 2016-04-09 ENCOUNTER — Other Ambulatory Visit: Payer: Self-pay | Admitting: Internal Medicine

## 2016-05-20 ENCOUNTER — Telehealth: Payer: Self-pay | Admitting: Internal Medicine

## 2016-05-20 ENCOUNTER — Other Ambulatory Visit (INDEPENDENT_AMBULATORY_CARE_PROVIDER_SITE_OTHER): Payer: Medicare Other

## 2016-05-20 ENCOUNTER — Ambulatory Visit (INDEPENDENT_AMBULATORY_CARE_PROVIDER_SITE_OTHER): Payer: Medicare Other | Admitting: Internal Medicine

## 2016-05-20 ENCOUNTER — Encounter: Payer: Self-pay | Admitting: Internal Medicine

## 2016-05-20 VITALS — BP 140/84 | HR 90 | Temp 98.2°F | Resp 14 | Ht 66.0 in | Wt 219.0 lb

## 2016-05-20 DIAGNOSIS — R5383 Other fatigue: Secondary | ICD-10-CM | POA: Diagnosis not present

## 2016-05-20 DIAGNOSIS — M5442 Lumbago with sciatica, left side: Secondary | ICD-10-CM

## 2016-05-20 DIAGNOSIS — M5441 Lumbago with sciatica, right side: Secondary | ICD-10-CM | POA: Diagnosis not present

## 2016-05-20 DIAGNOSIS — F419 Anxiety disorder, unspecified: Secondary | ICD-10-CM

## 2016-05-20 LAB — CBC
HCT: 42.5 % (ref 36.0–46.0)
Hemoglobin: 14 g/dL (ref 12.0–15.0)
MCHC: 33 g/dL (ref 30.0–36.0)
MCV: 85.6 fl (ref 78.0–100.0)
Platelets: 327 10*3/uL (ref 150.0–400.0)
RBC: 4.96 Mil/uL (ref 3.87–5.11)
RDW: 13.1 % (ref 11.5–15.5)
WBC: 7.3 10*3/uL (ref 4.0–10.5)

## 2016-05-20 LAB — COMPREHENSIVE METABOLIC PANEL
ALT: 13 U/L (ref 0–35)
AST: 17 U/L (ref 0–37)
Albumin: 4.3 g/dL (ref 3.5–5.2)
Alkaline Phosphatase: 57 U/L (ref 39–117)
BUN: 14 mg/dL (ref 6–23)
CO2: 31 mEq/L (ref 19–32)
Calcium: 9.5 mg/dL (ref 8.4–10.5)
Chloride: 107 mEq/L (ref 96–112)
Creatinine, Ser: 0.67 mg/dL (ref 0.40–1.20)
GFR: 111.72 mL/min (ref >60.00)
Glucose, Bld: 95 mg/dL (ref 70–99)
Potassium: 3.8 mEq/L (ref 3.5–5.1)
Sodium: 144 mEq/L (ref 135–145)
Total Bilirubin: 0.4 mg/dL (ref 0.2–1.2)
Total Protein: 6.7 g/dL (ref 6.0–8.3)

## 2016-05-20 LAB — LIPID PANEL
Cholesterol: 231 mg/dL — ABNORMAL HIGH (ref 0–200)
HDL: 60.8 mg/dL (ref >39.00)
LDL Cholesterol: 153 mg/dL — ABNORMAL HIGH (ref 0–99)
NonHDL: 170.68
Total CHOL/HDL Ratio: 4
Triglycerides: 87 mg/dL (ref 0.0–149.0)
VLDL: 17.4 mg/dL (ref 0.0–40.0)

## 2016-05-20 LAB — VITAMIN B12: Vitamin B-12: 442 pg/mL (ref 211–911)

## 2016-05-20 LAB — HEMOGLOBIN A1C: Hgb A1c MFr Bld: 5.8 % (ref 4.6–6.5)

## 2016-05-20 LAB — TSH: TSH: 1.12 u[IU]/mL (ref 0.35–4.50)

## 2016-05-20 LAB — VITAMIN D 25 HYDROXY (VIT D DEFICIENCY, FRACTURES): VITD: 17.89 ng/mL — ABNORMAL LOW (ref 30.00–100.00)

## 2016-05-20 MED ORDER — ALPRAZOLAM 0.25 MG PO TABS: 0.2500 mg | ORAL_TABLET | Freq: Every day | ORAL | 0 refills | Status: DC | PRN

## 2016-05-20 MED ORDER — GABAPENTIN 300 MG PO CAPS: 300.0000 mg | ORAL_CAPSULE | Freq: Two times a day (BID) | ORAL | 0 refills | Status: DC | PRN

## 2016-05-20 MED ORDER — RANITIDINE HCL 150 MG PO TABS
150.0000 mg | ORAL_TABLET | Freq: Every day | ORAL | 3 refills | Status: DC
Start: 2016-05-20 — End: 2016-06-05

## 2016-05-20 MED ORDER — MOMETASONE FUROATE 0.1 % EX CREA: TOPICAL_CREAM | CUTANEOUS | 3 refills | Status: DC

## 2016-05-20 NOTE — Progress Notes (Signed)
Pre visit review using our clinic review tool, if applicable. No additional management support is needed unless otherwise documented below in the visit note. 

## 2016-05-20 NOTE — Telephone Encounter (Signed)
Pt called requesting to transfer care to Dr Alain Marion. Pt is currently with Dr Sharlet Salina but was previously a pt of Dr. Asa Lente. She said that her sister sees Dr Alain Marion and highly recommends him.  I told her that Dr Alain Marion is not taking new pts right now but she asked that I please send a message back to see if he would accept her as a pt. Is this okay for her to transfer care?

## 2016-05-20 NOTE — Assessment & Plan Note (Signed)
Given that #40 lasted an entire year will fill for #10 which is a 3 month supply and will discuss further at her physical. Counseled about risk of memory problems and increase in fall risk from long term usage.

## 2016-05-20 NOTE — Patient Instructions (Signed)
We are checking the labs today and will call you back with the results.   It would be a good idea to schedule your physical so we can talk about your overalll health.   We have sent in a medicine for the back pain which should help called gabapentin. You can take 1 pill up to twice a day as needed for pain.   We have sent in a refill of the xanax as well today.   We are recommending physical therapy for the pain in the back and you will get a call about this to schedule to help the pain go away.    Back Exercises The following exercises strengthen the muscles that help to support the back. They also help to keep the lower back flexible. Doing these exercises can help to prevent back pain or lessen existing pain. If you have back pain or discomfort, try doing these exercises 2-3 times each day or as told by your health care provider. When the pain goes away, do them once each day, but increase the number of times that you repeat the steps for each exercise (do more repetitions). If you do not have back pain or discomfort, do these exercises once each day or as told by your health care provider. Exercises Single Knee to Chest   Repeat these steps 3-5 times for each leg: 1. Lie on your back on a firm bed or the floor with your legs extended. 2. Bring one knee to your chest. Your other leg should stay extended and in contact with the floor. 3. Hold your knee in place by grabbing your knee or thigh. 4. Pull on your knee until you feel a gentle stretch in your lower back. 5. Hold the stretch for 10-30 seconds. 6. Slowly release and straighten your leg. Pelvic Tilt   Repeat these steps 5-10 times: 1. Lie on your back on a firm bed or the floor with your legs extended. 2. Bend your knees so they are pointing toward the ceiling and your feet are flat on the floor. 3. Tighten your lower abdominal muscles to press your lower back against the floor. This motion will tilt your pelvis so your tailbone  points up toward the ceiling instead of pointing to your feet or the floor. 4. With gentle tension and even breathing, hold this position for 5-10 seconds. Cat-Cow   Repeat these steps until your lower back becomes more flexible: 1. Get into a hands-and-knees position on a firm surface. Keep your hands under your shoulders, and keep your knees under your hips. You may place padding under your knees for comfort. 2. Let your head hang down, and point your tailbone toward the floor so your lower back becomes rounded like the back of a cat. 3. Hold this position for 5 seconds. 4. Slowly lift your head and point your tailbone up toward the ceiling so your back forms a sagging arch like the back of a cow. 5. Hold this position for 5 seconds. Press-Ups   Repeat these steps 5-10 times: 1. Lie on your abdomen (face-down) on the floor. 2. Place your palms near your head, about shoulder-width apart. 3. While you keep your back as relaxed as possible and keep your hips on the floor, slowly straighten your arms to raise the top half of your body and lift your shoulders. Do not use your back muscles to raise your upper torso. You may adjust the placement of your hands to make yourself more comfortable.  4. Hold this position for 5 seconds while you keep your back relaxed. 5. Slowly return to lying flat on the floor. Bridges   Repeat these steps 10 times: 1. Lie on your back on a firm surface. 2. Bend your knees so they are pointing toward the ceiling and your feet are flat on the floor. 3. Tighten your buttocks muscles and lift your buttocks off of the floor until your waist is at almost the same height as your knees. You should feel the muscles working in your buttocks and the back of your thighs. If you do not feel these muscles, slide your feet 1-2 inches farther away from your buttocks. 4. Hold this position for 3-5 seconds. 5. Slowly lower your hips to the starting position, and allow your buttocks  muscles to relax completely. If this exercise is too easy, try doing it with your arms crossed over your chest. Abdominal Crunches   Repeat these steps 5-10 times: 1. Lie on your back on a firm bed or the floor with your legs extended. 2. Bend your knees so they are pointing toward the ceiling and your feet are flat on the floor. 3. Cross your arms over your chest. 4. Tip your chin slightly toward your chest without bending your neck. 5. Tighten your abdominal muscles and slowly raise your trunk (torso) high enough to lift your shoulder blades a tiny bit off of the floor. Avoid raising your torso higher than that, because it can put too much stress on your low back and it does not help to strengthen your abdominal muscles. 6. Slowly return to your starting position. Back Lifts  Repeat these steps 5-10 times: 1. Lie on your abdomen (face-down) with your arms at your sides, and rest your forehead on the floor. 2. Tighten the muscles in your legs and your buttocks. 3. Slowly lift your chest off of the floor while you keep your hips pressed to the floor. Keep the back of your head in line with the curve in your back. Your eyes should be looking at the floor. 4. Hold this position for 3-5 seconds. 5. Slowly return to your starting position. Contact a health care provider if:  Your back pain or discomfort gets much worse when you do an exercise.  Your back pain or discomfort does not lessen within 2 hours after you exercise. If you have any of these problems, stop doing these exercises right away. Do not do them again unless your health care provider says that you can. Get help right away if:  You develop sudden, severe back pain. If this happens, stop doing the exercises right away. Do not do them again unless your health care provider says that you can. This information is not intended to replace advice given to you by your health care provider. Make sure you discuss any questions you have  with your health care provider. Document Released: 02/21/2004 Document Revised: 05/23/2015 Document Reviewed: 03/09/2014 Elsevier Interactive Patient Education  2017 [employer].

## 2016-05-20 NOTE — Assessment & Plan Note (Signed)
Rx for gabapentin. She has not tried anything but hydrocodone in the past and has become accustomed to keeping it around the house and using as needed. Talked to her about safety risk and with her sciatica gabapentin is a better option. No refill given of hydrocodone today since she is not on chronically and removed from list for now. Rx for gabapentin to try for pain and continue using ibuprofen for the pain as it is more appropriate.

## 2016-05-20 NOTE — Progress Notes (Signed)
   Subjective:    Patient ID: Patricia Ramos, female    DOB: Jun 10, 1945, 71 y.o.   MRN: [id number]  HPI The patient is a 71 YO female coming in for several concerns. She is having low energy (gaining weight over the last year, denies depression, more SOB with exertion, low energy and is drinking more caffeine and energy drinks to help her get through the day), and low back pain (in the middle with radiation to both thighs, injured her back there about 3 years ago and sometimes it does act up, taking ibuprofen 800 mg for it which helps some, she is not doing much, no stretching, she has taken a hydrocodone which she likes to keep at home in case needed and wants refill today of that), as well as her anxiety (she does use xanax rarely and wants refill today, <40 in the last year, uses rarely, denies worsening depression although mood is worse lately with change in weight and activity).   Review of Systems  Constitutional: Negative.   HENT: Negative for congestion, dental problem, drooling, nosebleeds, postnasal drip, rhinorrhea, sinus pain, sinus pressure, sore throat and trouble swallowing.   Eyes: Negative.   Respiratory: Negative.   Cardiovascular: Negative.   Gastrointestinal: Negative.   Musculoskeletal: Positive for arthralgias, back pain and myalgias. Negative for gait problem, joint swelling, neck pain and neck stiffness.  Skin: Negative.   Neurological: Negative.   Psychiatric/Behavioral: The patient is nervous/anxious.       Objective:   Physical Exam  Constitutional: She is oriented to person, place, and time. She appears well-developed and well-nourished.  HENT:  Head: Normocephalic and atraumatic.  Right Ear: External ear normal.  Left Ear: External ear normal.  Eyes: EOM are normal.  Neck: Normal range of motion.  Cardiovascular: Normal rate and regular rhythm.   Pulmonary/Chest: Effort normal and breath sounds normal. No respiratory distress. She has no wheezes. She has no  rales.  Abdominal: Soft. She exhibits no distension. There is no tenderness.  Musculoskeletal: She exhibits tenderness.  Pain in the lumbar spinally with radiation to the thighs as well as paraspinally.   Neurological: She is alert and oriented to person, place, and time. Coordination normal.  Skin: Skin is warm and dry.  Psychiatric: She has a normal mood and affect.   Vitals:   05/20/16 0931  BP: 140/84  Pulse: 90  Resp: 14  Temp: 98.2 F (36.8 C)  TempSrc: Oral  SpO2: 100%  Weight: 219 lb (99.3 kg)  Height: 5\' 6"  (1.676 m)      Assessment & Plan:

## 2016-05-20 NOTE — Telephone Encounter (Signed)
Fine with me

## 2016-05-20 NOTE — Assessment & Plan Note (Signed)
Checking labs including CMP, CBC, TSH, B12, vitamin D for any metabolic changes. Treat as appropriate.

## 2016-05-27 ENCOUNTER — Telehealth: Payer: Self-pay | Admitting: Internal Medicine

## 2016-05-27 NOTE — Telephone Encounter (Signed)
Pt called in and said that she was seen last week and she said that the meds that she was given is not helping.  She would like a muscle relaxer.  She said that she is still having the pain down her leg  Pharmacy - [facility] on [address]

## 2016-05-27 NOTE — Telephone Encounter (Signed)
Is this helping at all, none, some?

## 2016-05-28 MED ORDER — METHOCARBAMOL 500 MG PO TABS: 500.0000 mg | ORAL_TABLET | Freq: Three times a day (TID) | ORAL | 0 refills | Status: DC | PRN

## 2016-05-28 NOTE — Telephone Encounter (Signed)
Patient aware.

## 2016-05-28 NOTE — Telephone Encounter (Signed)
Have sent in muscle relaxer called methocarbamol which can be taken 1 pill TID prn.

## 2016-05-28 NOTE — Telephone Encounter (Signed)
Contacted patient and she stated that it is not helping at all, she is mixing it with tylenol as well and said nothing is helping

## 2016-06-05 ENCOUNTER — Ambulatory Visit (INDEPENDENT_AMBULATORY_CARE_PROVIDER_SITE_OTHER): Payer: Medicare Other | Admitting: Family Medicine

## 2016-06-05 ENCOUNTER — Encounter: Payer: Self-pay | Admitting: Family Medicine

## 2016-06-05 DIAGNOSIS — M5441 Lumbago with sciatica, right side: Secondary | ICD-10-CM | POA: Diagnosis not present

## 2016-06-05 DIAGNOSIS — E785 Hyperlipidemia, unspecified: Secondary | ICD-10-CM

## 2016-06-05 DIAGNOSIS — F419 Anxiety disorder, unspecified: Secondary | ICD-10-CM | POA: Diagnosis not present

## 2016-06-05 DIAGNOSIS — K219 Gastro-esophageal reflux disease without esophagitis: Secondary | ICD-10-CM

## 2016-06-05 DIAGNOSIS — M5442 Lumbago with sciatica, left side: Secondary | ICD-10-CM | POA: Diagnosis not present

## 2016-06-05 DIAGNOSIS — E559 Vitamin D deficiency, unspecified: Secondary | ICD-10-CM

## 2016-06-05 MED ORDER — ASPIRIN EC 81 MG PO TBEC: 81.0000 mg | DELAYED_RELEASE_TABLET | Freq: Every day | ORAL | Status: DC

## 2016-06-05 MED ORDER — GABAPENTIN 600 MG PO TABS: 600.0000 mg | ORAL_TABLET | Freq: Three times a day (TID) | ORAL | Status: DC

## 2016-06-05 MED ORDER — ATORVASTATIN CALCIUM 40 MG PO TABS: 40.0000 mg | ORAL_TABLET | Freq: Every day | ORAL | 3 refills | Status: DC

## 2016-06-05 MED ORDER — METHOCARBAMOL 500 MG PO TABS: 500.0000 mg | ORAL_TABLET | Freq: Every evening | ORAL | 0 refills | Status: DC | PRN

## 2016-06-05 NOTE — Assessment & Plan Note (Signed)
For now focus on medical management.  Will try regular tylenol plus gabapentin.  At patient's request, kept her on the muscle relaxer at night prn.

## 2016-06-05 NOTE — Assessment & Plan Note (Signed)
I am not clear how well documented.  Last Vit D level I could find was 2010.  Consider remeasuring to assess whether still need supplement.

## 2016-06-05 NOTE — Progress Notes (Signed)
   Subjective:    Patient ID: Patricia Ramos, female    DOB: 26-Apr-1945, 72 y.o.   MRN: [id number]  HPI New patient.  Fortunately, she has previously been a [facility] patient.  Thus, problem list and meds already in our system.    Her main problem is low back pain radiating down left leg.  She relates that her back pain began with a fall landing on her right buttocks 2 years ago.  Pain initially went down right leg.  Over time, "it switched" and is now radiating down left leg.  (There is minor lingering right leg radiation.) She denies bowel or bladder involvement.  No saddle anesthesia and now weakness.  Reviewed CT of lumbar spine from 09/01/2014 which shows considerable disc disease, facet arthropathy with nerve root impingement.  She has tried gabapentin 300 in the past without benefit.  Currently on ibuprofen 800 4x/eays (high dose for her age) and Robaxin (a Beers drug.)  Does not want narcotic.  Wants to know her options.    Other issues High cholesterol.  Not on ASA or statin.  Calculated 10 year risk is 15% Vit D deficiency on supplemental D. Rarely takes benzo. 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  As an overweight AA woman, I did not push this screening test because of low risk.   Has a hx of GERD for which she takes omeprozole.  Current use of NSAID is giving some stomach upset.       Review of Systems     Objective:   Physical Exam        Assessment & Plan:

## 2016-06-05 NOTE — Assessment & Plan Note (Signed)
Stop NSAID, cont omeprazole.

## 2016-06-05 NOTE — Patient Instructions (Addendum)
Because of your cholesterol, you have a 15% chance of having a heart attack or stroke in the next ten years.  To reduce that risk, I want you to : 1. Take an 81 mg aspirin every day 2. Take a cholesterol lowering medication daily.  I sent in a prescription. If you want to read up, look up bone density testing to screen for osteoporosis. 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  Those problems are causing pinched nerves in both legs. In general we treat this with regular pain medications and with nerve pain medications.  For regular pain, the safest long term mediation is tylenol/generic acetomenophen. Either two regular strength four times a day or 2 extra strength three times a day.  This is much safer than the high dose ibuprofen.  For the nerve pain, I am going to prescribe a new medication, gabapentin. Please try taking two of your 300mg  tabs three times a day.  There are other options for nerve pain if the gabapentin dose not work. Stop the ibuprofen and for now, just use the muscle relaxer at night.    See me in 3-4 weeks to tweak your medications.  I will likely not get it just right on the first try.  But if we work together, we ought to be able to find a combination that manages your pain.    Call me if you need more gabapentin before the next visit.

## 2016-06-05 NOTE — Assessment & Plan Note (Signed)
Start statin and aspirin.

## 2016-06-05 NOTE — Assessment & Plan Note (Signed)
Has alprazolam.  I did not change on this first visit.  Sounds like abuse potential is extremely low.

## 2016-06-10 NOTE — Telephone Encounter (Signed)
Unfortunately, I'm not able to accept any more new patients at this time.  I'm sorry! Thank you!  

## 2016-06-11 NOTE — Telephone Encounter (Signed)
LM informing pt.

## 2016-06-18 ENCOUNTER — Other Ambulatory Visit: Payer: Self-pay | Admitting: Internal Medicine

## 2016-06-19 ENCOUNTER — Ambulatory Visit (INDEPENDENT_AMBULATORY_CARE_PROVIDER_SITE_OTHER): Payer: Medicare Other | Admitting: Family Medicine

## 2016-06-19 ENCOUNTER — Encounter: Payer: Self-pay | Admitting: Family Medicine

## 2016-06-19 DIAGNOSIS — E785 Hyperlipidemia, unspecified: Secondary | ICD-10-CM | POA: Diagnosis not present

## 2016-06-19 DIAGNOSIS — M5441 Lumbago with sciatica, right side: Secondary | ICD-10-CM | POA: Diagnosis not present

## 2016-06-19 DIAGNOSIS — M5442 Lumbago with sciatica, left side: Secondary | ICD-10-CM | POA: Diagnosis not present

## 2016-06-19 MED ORDER — BACLOFEN 10 MG PO TABS: 10.0000 mg | ORAL_TABLET | Freq: Two times a day (BID) | ORAL | 12 refills | Status: DC | PRN

## 2016-06-19 MED ORDER — ATORVASTATIN CALCIUM 40 MG PO TABS: 20.0000 mg | ORAL_TABLET | Freq: Every day | ORAL | 3 refills | Status: DC

## 2016-06-19 NOTE — Progress Notes (Signed)
   Subjective:    Patient ID: Patricia Ramos, female    DOB: February 25, 1945, 71 y.o.   MRN: [id number]  HPI Fu several issues Low back pain with radiculopathy.  Higher dose gabapentin made her too sleepy.  Lower dose did not work.  She is comfortable with taking tylenol and ibuprofen at present.   The 40 mg dose of atorvastatin "did not agree with me."  She wants to know if wt loss alone will work.  Initially resistant to trying other statin.  Became more receptive when I talked about preventing heart attacks and strokes. She would like a refill on her muscle relaxer.  Currently on robaxin, a beers drug.    Review of Systems     Objective:   Physical Exam Lungs clear Cardiac RRR without m or g Abd benign Ext feet warm, good pulses, no skin breakdown.  Subjective decreased sensation bilaterally.        Assessment & Plan:

## 2016-06-19 NOTE — Patient Instructions (Addendum)
I am fine with you stopping the gabapentin and just using tylenol and ibuprofen. I am switching your muscle relaxer to one that is safer in the elderly. I really want you to work with me to find a dose of statin that you can take, because it decreases your chance of heart attack or stroke.  For now you will get back on the atorvastatin but only take a half pill every day. It is safe for you to take up to 8 of the 325 tylenol per day - that would be 2 tabs, four times a day. See me in three months and we will recheck your cholesterol - sooner if problems.

## 2016-06-19 NOTE — Assessment & Plan Note (Signed)
OTC meds ok.  Switch robaxin to baclofen.

## 2016-06-19 NOTE — Assessment & Plan Note (Signed)
After speaking, she agreed to try cutting lipitor in half.

## 2016-08-28 ENCOUNTER — Other Ambulatory Visit: Payer: Self-pay | Admitting: *Deleted

## 2016-09-03 ENCOUNTER — Other Ambulatory Visit: Payer: Self-pay | Admitting: Family Medicine

## 2016-09-03 ENCOUNTER — Ambulatory Visit (INDEPENDENT_AMBULATORY_CARE_PROVIDER_SITE_OTHER): Payer: Medicare Other | Admitting: Family Medicine

## 2016-09-03 DIAGNOSIS — M1711 Unilateral primary osteoarthritis, right knee: Secondary | ICD-10-CM

## 2016-09-03 DIAGNOSIS — E785 Hyperlipidemia, unspecified: Secondary | ICD-10-CM | POA: Diagnosis not present

## 2016-09-04 ENCOUNTER — Encounter: Payer: Self-pay | Admitting: Family Medicine

## 2016-09-04 LAB — LACTATE DEHYDROGENASE: LDH: 210 IU/L (ref 119–226)

## 2016-09-05 MED ORDER — ATORVASTATIN CALCIUM 40 MG PO TABS: 40.0000 mg | ORAL_TABLET | Freq: Every day | ORAL | 3 refills | Status: DC

## 2016-09-05 MED ORDER — IBUPROFEN 800 MG PO TABS: 800.0000 mg | ORAL_TABLET | Freq: Two times a day (BID) | ORAL | 3 refills | Status: DC | PRN

## 2016-09-05 NOTE — Assessment & Plan Note (Signed)
Requests refill on ibuprofen.  No renal or GI side effects.  Takes less than 1 per day on average.  Will refill.

## 2016-09-05 NOTE — Assessment & Plan Note (Signed)
Computers were down and d LDL did not flow into the results.  DLDL=210.  She will start atorvastatin 40 mg daily and recheck in 3 months.

## 2016-09-05 NOTE — Patient Instructions (Signed)
Computers down during visit.  No AVS printed. I called with LDL results. Restart atorvastatin, 40 mg daily Go back to healthy diet. See me in three months for chol recheck and flu shot.

## 2016-09-05 NOTE — Progress Notes (Signed)
   Subjective:    Patient ID: Patricia Ramos, female    DOB: November 11, 1945, 71 y.o.   MRN: [id number]  HPI Two issues. 1. High cholesterol.  Did not take atorvastatin beyond a few days.  Hoped to get by without it.  She also said she liberalized her diet.  Eating right did not help.  I thought I'd go back to eating what I want.  She wants another cholesterol check.  Hopes she no longer needs the medicine. 2. Wants refill on ibuprofen for her knee pain.    Review of Systems     Objective:   Physical ExamLungs clear Cardiac RRR without m or g        Assessment & Plan:

## 2016-11-19 ENCOUNTER — Ambulatory Visit: Payer: Medicare Other | Admitting: Family Medicine

## 2016-12-11 ENCOUNTER — Ambulatory Visit (INDEPENDENT_AMBULATORY_CARE_PROVIDER_SITE_OTHER): Payer: Medicare Other | Admitting: Family Medicine

## 2016-12-11 ENCOUNTER — Encounter: Payer: Self-pay | Admitting: Family Medicine

## 2016-12-11 DIAGNOSIS — F419 Anxiety disorder, unspecified: Secondary | ICD-10-CM

## 2016-12-11 DIAGNOSIS — K5900 Constipation, unspecified: Secondary | ICD-10-CM

## 2016-12-11 DIAGNOSIS — E785 Hyperlipidemia, unspecified: Secondary | ICD-10-CM | POA: Diagnosis not present

## 2016-12-11 DIAGNOSIS — R5383 Other fatigue: Secondary | ICD-10-CM | POA: Diagnosis not present

## 2016-12-11 LAB — POCT SEDIMENTATION RATE: POCT SED RATE: 2 mm/hr (ref 0–22)

## 2016-12-11 MED ORDER — TRAZODONE HCL 50 MG PO TABS: 25.0000 mg | ORAL_TABLET | Freq: Every evening | ORAL | 3 refills | Status: DC | PRN

## 2016-12-11 NOTE — Patient Instructions (Addendum)
I will call tomorrow with the blood test results. Likely, I will cut the dose of your cholesterol pill and see if that helps with more energy and less pain. I sent in a safe sleeping pill to your pharmacy. You will get the last pneumonia shot today. I will also send in a safe medication you can take daily  Get off the benadryl and tylenol PM.  It will make your constipation worse.   Get on the miralax regularly.   The new shingles vaccine is called Shingrix.  Research it and we will talk next visit about whether you want the shot.

## 2016-12-12 ENCOUNTER — Encounter: Payer: Self-pay | Admitting: Family Medicine

## 2016-12-12 DIAGNOSIS — K5900 Constipation, unspecified: Secondary | ICD-10-CM | POA: Insufficient documentation

## 2016-12-12 LAB — LDL CHOLESTEROL, DIRECT: LDL Direct: 87 mg/dL (ref 0–99)

## 2016-12-12 LAB — CK: Total CK: 102 U/L (ref 24–173)

## 2016-12-12 MED ORDER — ATORVASTATIN CALCIUM 10 MG PO TABS: 10.0000 mg | ORAL_TABLET | Freq: Every day | ORAL | 3 refills | Status: DC

## 2016-12-12 NOTE — Assessment & Plan Note (Signed)
May need further WU if sx do not resolve with decrease lipitor.

## 2016-12-12 NOTE — Assessment & Plan Note (Signed)
Nice response to lipitor, but likely side effects.  Will decrease dose to 10 mg daily.  Hopefully side effects will improve.  Check CK (nl) and sed rate (nl).

## 2016-12-12 NOTE — Assessment & Plan Note (Signed)
Stop benadryl.  Restart miralax

## 2016-12-12 NOTE — Progress Notes (Signed)
   Subjective:    Patient ID: Patricia Ramos, female    DOB: 08-30-1945, 71 y.o.   MRN: [id number]  HPI FU hypercholesterolemia.  Patient begun on lipitor 40 and has taken regularly.  She complains of muscle aches and lack of energy since starting the lipitor.  No other sx.  Denies CP, wt loss, or change in appetite, bowel or bladder.  Had flu shot elsewhere.  Needs second pneumoia vaccine.  Not sleeping at night.  Taking benadryl and tylenol PM.  Wants something for nerves.  Constipated.  Poor prior response to miralax.    Review of Systems     Objective:   Physical ExamLungs clear Cardiac RRR without m or g        Assessment & Plan:

## 2016-12-12 NOTE — Assessment & Plan Note (Signed)
Add trazodone at night.  Stop benadryl

## 2017-01-15 ENCOUNTER — Telehealth: Payer: Self-pay | Admitting: *Deleted

## 2017-01-15 ENCOUNTER — Other Ambulatory Visit: Payer: Self-pay | Admitting: Family Medicine

## 2017-01-15 DIAGNOSIS — F419 Anxiety disorder, unspecified: Secondary | ICD-10-CM

## 2017-01-15 MED ORDER — ZOLPIDEM TARTRATE 5 MG PO TABS: 5.0000 mg | ORAL_TABLET | Freq: Every evening | ORAL | 5 refills | Status: DC | PRN

## 2017-01-15 MED ORDER — RAMELTEON 8 MG PO TABS: 4.0000 mg | ORAL_TABLET | Freq: Every day | ORAL | 3 refills | Status: DC

## 2017-01-15 NOTE — Telephone Encounter (Signed)
Patient left message on nurse line stating "new medication for sleep is causing a real bad headache." Asking if there is something else she can take instead. Hubbard Hartshorn, RN, BSN

## 2017-01-29 ENCOUNTER — Other Ambulatory Visit: Payer: Self-pay

## 2017-01-29 ENCOUNTER — Ambulatory Visit (INDEPENDENT_AMBULATORY_CARE_PROVIDER_SITE_OTHER): Payer: Medicare Other | Admitting: Family Medicine

## 2017-01-29 ENCOUNTER — Encounter: Payer: Self-pay | Admitting: Family Medicine

## 2017-01-29 VITALS — BP 136/88 | HR 81 | Temp 98.2°F | Ht 66.0 in | Wt 213.2 lb

## 2017-01-29 DIAGNOSIS — G4700 Insomnia, unspecified: Secondary | ICD-10-CM

## 2017-01-29 DIAGNOSIS — K219 Gastro-esophageal reflux disease without esophagitis: Secondary | ICD-10-CM | POA: Diagnosis not present

## 2017-01-29 DIAGNOSIS — E785 Hyperlipidemia, unspecified: Secondary | ICD-10-CM

## 2017-01-29 DIAGNOSIS — Z23 Encounter for immunization: Secondary | ICD-10-CM | POA: Diagnosis not present

## 2017-01-29 DIAGNOSIS — E669 Obesity, unspecified: Secondary | ICD-10-CM | POA: Diagnosis not present

## 2017-01-29 NOTE — Patient Instructions (Addendum)
I am delighted that you are feeling better.  Because you are, you should keep doing the things are currently doing.   Stay on the aspirin.   Absolutely, eat a healthier diet and get back to some regular exercise.  Those natural activities are the path to good health.   Call in early February for a "lab only" appointment.  The order is already in for your cholesterol check.  I will call the next day.  You do not need to be fasting for that blood test.   I am going to ignore both the sleep and indigestion problems today.   Daughter, please google "sleep hygiene."  Lots of good natural suggestions for better sleep.

## 2017-01-30 ENCOUNTER — Encounter: Payer: Self-pay | Admitting: Family Medicine

## 2017-01-30 DIAGNOSIS — G4700 Insomnia, unspecified: Secondary | ICD-10-CM | POA: Insufficient documentation

## 2017-01-30 NOTE — Assessment & Plan Note (Signed)
Offered H2 blocker.  She wants to try diet alone.

## 2017-01-30 NOTE — Assessment & Plan Note (Signed)
Reinforced diet and exercise.  Great for long term health.

## 2017-01-30 NOTE — Assessment & Plan Note (Signed)
Recheck lipids after off statin and on natural products x 6 weeks.

## 2017-01-30 NOTE — Assessment & Plan Note (Signed)
Sleep hygeine 

## 2017-01-30 NOTE — Progress Notes (Signed)
   Subjective:    Patient ID: Cassell Clement, female    DOB: 03-20-1945, 72 y.o.   MRN: [id number]  HPI On the good side, the patient feels great now.  Issues: 1. Hypercholesterolemia - primary prevention.  No previous CAD or CVA.  Unfortunately, she was intollerent of low dose statin.  She quit statin and started a slew of natural products.  Reviewed bottles.  None seem worrisome.  Only stopped two weeks ago, so too early for d LDL. 2. GERD.  Symptomatic and was relieved by omeprazole.  She does not want to take - worried about long term side effects.   3. Insomnia.  Trazodone ineffective.  Could not afford prescription.  She does well with Tylenol PM (benadryl).  Once again discouraged its use.   4. Obesity - taking an interest in healthier lifestyle.  Committed to eating better.  Will exercise more now that she does not have myalgias.      Review of Systems     Objective:   Physical Exam  Lungs clear Cardiac RRR without m or g Abd benign.        Assessment & Plan:

## 2017-02-16 ENCOUNTER — Encounter: Payer: Self-pay | Admitting: Family Medicine

## 2017-02-27 ENCOUNTER — Other Ambulatory Visit: Payer: Medicare Other

## 2017-02-27 DIAGNOSIS — E785 Hyperlipidemia, unspecified: Secondary | ICD-10-CM

## 2017-02-28 LAB — LDL CHOLESTEROL, DIRECT: LDL Direct: 155 mg/dL — ABNORMAL HIGH (ref 0–99)

## 2017-03-02 MED ORDER — RED YEAST RICE 600 MG PO CAPS: 600.0000 mg | ORAL_CAPSULE | Freq: Every day | ORAL | Status: DC

## 2017-03-02 MED ORDER — EZETIMIBE 10 MG PO TABS: 10.0000 mg | ORAL_TABLET | Freq: Every day | ORAL | 6 refills | Status: DC

## 2017-03-02 NOTE — Progress Notes (Signed)
Informed LDL markedly elevated off statin.  Statin caused myalgias.  Will start both red yeast rice and zetia

## 2017-03-02 NOTE — Addendum Note (Signed)
Addended by: Zenia Resides on: 03/02/2017 10:05 AM   Modules accepted: Orders

## 2017-05-22 ENCOUNTER — Telehealth: Payer: Self-pay

## 2017-05-22 DIAGNOSIS — E782 Mixed hyperlipidemia: Secondary | ICD-10-CM

## 2017-05-22 NOTE — Telephone Encounter (Signed)
Patient called and asked for a refill of Atorvastatin (not on current med list). She is taking (2) 10 mg tablets daily. Requests a Rx for 20 mg tablets.   States she is not taking Zetia because it made her feel weird.   Call back is [phone].  Danley Danker, RN [facility] RN)

## 2017-05-25 MED ORDER — ATORVASTATIN CALCIUM 20 MG PO TABS: 20.0000 mg | ORAL_TABLET | Freq: Every day | ORAL | 3 refills | Status: DC

## 2017-05-25 NOTE — Telephone Encounter (Signed)
Called patient to verify.  Yes, tolerating 20 mg atorastatin.  Rx sent.

## 2017-08-19 ENCOUNTER — Encounter: Payer: Self-pay | Admitting: Family Medicine

## 2017-08-19 ENCOUNTER — Ambulatory Visit (INDEPENDENT_AMBULATORY_CARE_PROVIDER_SITE_OTHER): Payer: Medicare Other | Admitting: Family Medicine

## 2017-08-19 ENCOUNTER — Other Ambulatory Visit: Payer: Self-pay

## 2017-08-19 VITALS — BP 118/68 | HR 81 | Temp 98.2°F | Ht 66.0 in | Wt 214.2 lb

## 2017-08-19 DIAGNOSIS — E785 Hyperlipidemia, unspecified: Secondary | ICD-10-CM

## 2017-08-19 DIAGNOSIS — Z23 Encounter for immunization: Secondary | ICD-10-CM

## 2017-08-19 DIAGNOSIS — E782 Mixed hyperlipidemia: Secondary | ICD-10-CM | POA: Diagnosis not present

## 2017-08-19 DIAGNOSIS — G4700 Insomnia, unspecified: Secondary | ICD-10-CM | POA: Diagnosis not present

## 2017-08-19 DIAGNOSIS — S40811A Abrasion of right upper arm, initial encounter: Secondary | ICD-10-CM

## 2017-08-19 NOTE — Patient Instructions (Addendum)
I will call tomorrow and we will decide about the cholesterol medicine. Stop taking the aspirin.  Once you hit age 72, aspirin is more likely to cause problems with bleeding.

## 2017-08-20 ENCOUNTER — Encounter: Payer: Self-pay | Admitting: Family Medicine

## 2017-08-20 ENCOUNTER — Ambulatory Visit: Payer: Medicare Other | Admitting: Family Medicine

## 2017-08-20 DIAGNOSIS — S40811A Abrasion of right upper arm, initial encounter: Secondary | ICD-10-CM | POA: Insufficient documentation

## 2017-08-20 LAB — CMP14+EGFR
ALT: 17 IU/L (ref 0–32)
AST: 18 IU/L (ref 0–40)
Albumin/Globulin Ratio: 2 (ref 1.2–2.2)
Albumin: 4.5 g/dL (ref 3.5–4.8)
Alkaline Phosphatase: 72 IU/L (ref 39–117)
BUN/Creatinine Ratio: 23 (ref 12–28)
BUN: 15 mg/dL (ref 8–27)
Bilirubin Total: <0.2 mg/dL (ref 0.0–1.2)
CO2: 28 mmol/L (ref 20–29)
Calcium: 9.5 mg/dL (ref 8.7–10.3)
Chloride: 105 mmol/L (ref 96–106)
Creatinine, Ser: 0.65 mg/dL (ref 0.57–1.00)
GFR calc Af Amer: 103 mL/min/{1.73_m2} (ref >59)
GFR calc non Af Amer: 90 mL/min/{1.73_m2} (ref >59)
Globulin, Total: 2.2 g/dL (ref 1.5–4.5)
Glucose: 85 mg/dL (ref 65–99)
Potassium: 4.5 mmol/L (ref 3.5–5.2)
Sodium: 146 mmol/L — ABNORMAL HIGH (ref 134–144)
Total Protein: 6.7 g/dL (ref 6.0–8.5)

## 2017-08-20 LAB — LIPID PANEL
Chol/HDL Ratio: 4.1 ratio (ref 0.0–4.4)
Cholesterol, Total: 232 mg/dL — ABNORMAL HIGH (ref 100–199)
HDL: 57 mg/dL (ref >39)
LDL Calculated: 147 mg/dL — ABNORMAL HIGH (ref 0–99)
Triglycerides: 139 mg/dL (ref 0–149)
VLDL Cholesterol Cal: 28 mg/dL (ref 5–40)

## 2017-08-20 MED ORDER — ATORVASTATIN CALCIUM 20 MG PO TABS: 20.0000 mg | ORAL_TABLET | Freq: Every day | ORAL | 3 refills | Status: DC

## 2017-08-20 NOTE — Assessment & Plan Note (Addendum)
Called with results of lipid panel.  She will restart her atorvastatin, which she has not been taking.  Recheck direct LDL in 3 months.  Also, because she is >70 and primary prevention of CAD, will DC ASA.

## 2017-08-20 NOTE — Assessment & Plan Note (Signed)
She will switch from Tylenol PM to Melatonin based on dementia risk.

## 2017-08-20 NOTE — Assessment & Plan Note (Signed)
Tetanus shot

## 2017-08-20 NOTE — Progress Notes (Signed)
   Subjective:    Patient ID: Patricia Ramos, female    DOB: 1945/04/10, 72 y.o.   MRN: [id number]  HPI  Multiple issues 1. Hypercholesterolemia.  Not taking atorvastatin.  Using herbs and red yeast rice.  Wants recheck. 2. Insomnia.  Using tylenol PM.  Discussed recent data with association of anticholiergic drugs and dementia. 3. HPDP Needs tetanus.  Has abrasion on right arm Due for bone density.  As an overweight black woman she is at lower risk.  Based on that risk, she declined bone density.    Review of Systems     Objective:   Physical Exam  Lungs clear Cardiac RRR without m or g Minor abrasion of right dorsal forearm without evidence of infection.         Assessment & Plan:

## 2017-09-09 ENCOUNTER — Ambulatory Visit: Payer: Medicare Other | Admitting: Family Medicine

## 2017-12-14 ENCOUNTER — Other Ambulatory Visit: Payer: Self-pay | Admitting: Family Medicine

## 2017-12-14 ENCOUNTER — Other Ambulatory Visit: Payer: Self-pay | Admitting: Internal Medicine

## 2017-12-14 DIAGNOSIS — M1711 Unilateral primary osteoarthritis, right knee: Secondary | ICD-10-CM

## 2017-12-18 ENCOUNTER — Other Ambulatory Visit: Payer: Self-pay | Admitting: Family Medicine

## 2017-12-18 MED ORDER — IBUPROFEN 600 MG PO TABS: 600.0000 mg | ORAL_TABLET | Freq: Three times a day (TID) | ORAL | 3 refills | Status: DC | PRN

## 2017-12-18 MED ORDER — MOMETASONE FUROATE 0.1 % EX CREA: TOPICAL_CREAM | CUTANEOUS | 3 refills | Status: AC

## 2017-12-21 ENCOUNTER — Other Ambulatory Visit: Payer: Self-pay

## 2017-12-21 ENCOUNTER — Telehealth: Payer: Self-pay

## 2017-12-21 ENCOUNTER — Ambulatory Visit (INDEPENDENT_AMBULATORY_CARE_PROVIDER_SITE_OTHER): Payer: Medicare Other | Admitting: Family Medicine

## 2017-12-21 ENCOUNTER — Encounter: Payer: Self-pay | Admitting: Family Medicine

## 2017-12-21 DIAGNOSIS — J189 Pneumonia, unspecified organism: Secondary | ICD-10-CM

## 2017-12-21 MED ORDER — CEFTRIAXONE SODIUM 1 G IJ SOLR
1.0000 g | Freq: Once | INTRAMUSCULAR | Status: AC
  Administered 2017-12-21: 1 g via INTRAMUSCULAR

## 2017-12-21 MED ORDER — IPRATROPIUM BROMIDE 0.02 % IN SOLN
0.5000 mg | Freq: Once | RESPIRATORY_TRACT | Status: AC
  Administered 2017-12-21: 0.5 mg via RESPIRATORY_TRACT

## 2017-12-21 MED ORDER — AZITHROMYCIN 500 MG PO TABS: 500.0000 mg | ORAL_TABLET | Freq: Every day | ORAL | 0 refills | Status: DC

## 2017-12-21 MED ORDER — ALBUTEROL SULFATE (2.5 MG/3ML) 0.083% IN NEBU
2.5000 mg | INHALATION_SOLUTION | Freq: Once | RESPIRATORY_TRACT | Status: AC
  Administered 2017-12-21: 2.5 mg via RESPIRATORY_TRACT

## 2017-12-21 NOTE — Progress Notes (Signed)
Established Patient Office Visit  Subjective:  Patient ID: Patricia Ramos, female    DOB: 29-Nov-1945  Age: 72 y.o. MRN: [id number]  CC:  Chief Complaint  Patient presents with  . Nasal Congestion    HPI Patricia Ramos presents for CC is really resp infection.  One week hx of progressive respiratory tract infection.  Began as a head cold and has progressed to cough, wheezing and HA.  Denies hx of COPD or asthma.  Not immunocompromised.  Subjective fever. Has had flu shot this year.  Does not feel short of breath.  Does feel generally weak and ill.  Tolerating fluids well.  Past Medical History:  Diagnosis Date  . Anxiety   . COLONIC POLYPS, HX OF   . DEPRESSION   . GERD   . OBESITY    Starting weight 220 lb in 2011, then 40lb intentional weight loss, lowest 179    Past Surgical History:  Procedure Laterality Date  . CARPAL TUNNEL RELEASE     left wrist  . colonoscopy with polypectomy  2007   hyperplastic  . TUBAL LIGATION      Family History  Problem Relation Age of Onset  . Hypertension Mother   . Breast cancer Mother 68  . Hypertension Father   . Diabetes Father   . Hypertension Brother   . Hypertension Brother   . Hypertension Brother   . Colon cancer Neg Hx   . Esophageal cancer Neg Hx   . Rectal cancer Neg Hx   . Stomach cancer Neg Hx     Social History   Socioeconomic History  . Marital status: Divorced    Spouse name: Not on file  . Number of children: Not on file  . Years of education: Not on file  . Highest education level: Not on file  Occupational History  . Not on file  Social Needs  . Financial resource strain: Not on file  . Food insecurity:    Worry: Not on file    Inability: Not on file  . Transportation needs:    Medical: Not on file    Non-medical: Not on file  Tobacco Use  . Smoking status: Never Smoker  . Smokeless tobacco: Never Used  . Tobacco comment: Divorced-lives with 72 yo g-dtr. retired Quarry manager, working at [employer] since 2011  Substance and Sexual Activity  . Alcohol use: No    Alcohol/week: 0.0 standard drinks  . Drug use: No  . Sexual activity: Not on file  Lifestyle  . Physical activity:    Days per week: Not on file    Minutes per session: Not on file  . Stress: Not on file  Relationships  . Social connections:    Talks on phone: Not on file    Gets together: Not on file    Attends religious service: Not on file    Active member of club or organization: Not on file    Attends meetings of clubs or organizations: Not on file    Relationship status: Not on file  . Intimate partner violence:    Fear of current or ex partner: Not on file    Emotionally abused: Not on file    Physically abused: Not on file    Forced sexual activity: Not on file  Other Topics Concern  . Not on file  Social History Narrative  . Not on file    Outpatient Medications Prior to Visit  Medication Sig Dispense Refill  .  atorvastatin (LIPITOR) 20 MG tablet Take 1 tablet (20 mg total) by mouth daily. 90 tablet 3  . Cholecalciferol (VITAMIN D3) 50000 units TABS Take 1 tablet by mouth daily.    Marland Kitchen ibuprofen (ADVIL,MOTRIN) 600 MG tablet Take 1 tablet (600 mg total) by mouth every 8 (eight) hours as needed. 50 tablet 3  . mometasone (ELOCON) 0.1 % cream APPLY TO AFFECTED AREA AS NEEDED 45 g 3  . Red Yeast Rice 600 MG CAPS Take 1 capsule (600 mg total) by mouth daily.     No facility-administered medications prior to visit.     Allergies  Allergen Reactions  . Codeine Itching    Takes it with Benadryl and tolerates it well.  Marland Kitchen Penicillins Rash    ROS Review of Systems    Objective:    Physical Exam  Temp 98.2 F (36.8 C) (Oral)   Wt 207 lb (93.9 kg)   SpO2 93%   BMI 33.41 kg/m  Wt Readings from Last 3 Encounters:  12/21/17 207 lb (93.9 kg)  08/19/17 214 lb 3.2 oz (97.2 kg)  01/29/17 213 lb 3.2 oz (96.7 kg)   VS including weight noted.  Gen moderately illl appearing in no resp  distress. HEENT Canals mildly occluded by cerumen, but tMs visible and normal.  Throat red without exudate. Neck supple without sig nodes. Lungs bilateral exp wheezes. Cardiac RRR without m or g Abd benign.  Duoneb Rx given.  Wheezing did not improve and patient did not feel better.  Duration of visit was 25 minutes face-t0-face.  Health Maintenance Due  Topic Date Due  . DEXA SCAN  10/27/2010  . INFLUENZA VACCINE  08/27/2017    There are no preventive care reminders to display for this patient.  Lab Results  Component Value Date   TSH 1.12 05/20/2016   Lab Results  Component Value Date   WBC 7.3 05/20/2016   HGB 14.0 05/20/2016   HCT 42.5 05/20/2016   MCV 85.6 05/20/2016   PLT 327.0 05/20/2016   Lab Results  Component Value Date   NA 146 (H) 08/19/2017   K 4.5 08/19/2017   CO2 28 08/19/2017   GLUCOSE 85 08/19/2017   BUN 15 08/19/2017   CREATININE 0.65 08/19/2017   BILITOT <0.2 08/19/2017   ALKPHOS 72 08/19/2017   AST 18 08/19/2017   ALT 17 08/19/2017   PROT 6.7 08/19/2017   ALBUMIN 4.5 08/19/2017   CALCIUM 9.5 08/19/2017   GFR 111.72 05/20/2016   Lab Results  Component Value Date   CHOL 232 (H) 08/19/2017   Lab Results  Component Value Date   HDL 57 08/19/2017   Lab Results  Component Value Date   LDLCALC 147 (H) 08/19/2017   Lab Results  Component Value Date   TRIG 139 08/19/2017   Lab Results  Component Value Date   CHOLHDL 4.1 08/19/2017   Lab Results  Component Value Date   HGBA1C 5.8 05/20/2016      Assessment & Plan:   Problem List Items Addressed This Visit    CAP (community acquired pneumonia)   Relevant Medications   azithromycin (ZITHROMAX) 500 MG tablet   Other Relevant Orders   DG Chest 2 View      Meds ordered this encounter  Medications  . azithromycin (ZITHROMAX) 500 MG tablet    Sig: Take 1 tablet (500 mg total) by mouth daily.    Dispense:  5 tablet    Refill:  0    Follow-up: No follow-ups  on file.     Zenia Resides, MD

## 2017-12-21 NOTE — Assessment & Plan Note (Signed)
Pneumonia by exam Will get CXR to confirm No indications for hospitalization at present.  At age 72, I am aware that she could quickly decompensate.  As such, begin RX in advance of CXR. Im Rocephin 1 gm. Azithro

## 2017-12-21 NOTE — Telephone Encounter (Signed)
Noted and agree. 

## 2017-12-21 NOTE — Patient Instructions (Signed)
I will call with X ray results.  Please be sure to start the antibiotic tonight.  Continue to drink plenty of fluids.  OK to keep taking tylenol and mucinex.  Please start taking your blood pressure medicine.

## 2017-12-21 NOTE — Telephone Encounter (Signed)
Pt called back scheduled, for 1:30 this afternoon. Ayce Pietrzyk, Salome Spotted, CMA

## 2017-12-21 NOTE — Telephone Encounter (Signed)
Called Patricia Ramos to let her know that Dr. Andria Frames can see her this afternoon. He needs to know what time she can be here. If Patricia Ramos calls, please ask Patricia Ramos when she can come in and let Dr. Andria Frames know. Ottis Stain, CMA

## 2017-12-28 ENCOUNTER — Telehealth: Payer: Self-pay

## 2017-12-28 ENCOUNTER — Ambulatory Visit
Admission: RE | Admit: 2017-12-28 | Discharge: 2017-12-28 | Disposition: A | Discharge status: Home / Self Care | Payer: Medicare Other | Source: Ambulatory Visit | Attending: Family Medicine | Admitting: Family Medicine

## 2017-12-28 DIAGNOSIS — J189 Pneumonia, unspecified organism: Secondary | ICD-10-CM

## 2017-12-28 NOTE — Telephone Encounter (Signed)
Called patient and asked her to get the CXR previously ordered.  I will call her again with results.  She is improving - SLOWLY.

## 2017-12-28 NOTE — Telephone Encounter (Signed)
Pt called nurse line stating she finished her last dose of azithro this past Friday. Pt states she still "feels terrible" and would like another round called in. Please let her know when this has been done, or if she needs to come in for another apt. Call back [phone].

## 2018-01-06 ENCOUNTER — Telehealth: Payer: Self-pay | Admitting: *Deleted

## 2018-01-06 NOTE — Telephone Encounter (Signed)
Called and LM.  I will call again.

## 2018-01-06 NOTE — Telephone Encounter (Signed)
Pt wants to know if there is anything that Dr. Andria Frames can call in for her.  She is still not felling great and "is tired of spending all my money on OTC meds"  She is agreeable to appt with Dr. Andria Frames for next Wednesday but wants to know what to do in the meantime.  Dr. Andria Frames,  If you feel she does not need to followup let me know and I can cancel.  I just didn't want her to loose the spot. [place], Salome Spotted, CMA

## 2018-01-06 NOTE — Telephone Encounter (Signed)
Called.  Main sx are cough and easy fatique.  Definitely, slowly improving.  Reassured.  Told that for now, keep appointment fro next week.  Cancel Monday if feeling better.  No further antibiotics indicated.

## 2018-01-13 ENCOUNTER — Ambulatory Visit: Payer: Medicare Other | Admitting: Family Medicine

## 2018-02-05 ENCOUNTER — Encounter: Payer: Self-pay | Admitting: Family Medicine

## 2018-08-13 ENCOUNTER — Telehealth: Payer: Self-pay

## 2018-08-13 NOTE — Telephone Encounter (Signed)
Do I need to inform pt she can not reschedule due to not accepting new patients anymore? appt is schedule for 7/21.

## 2018-08-13 NOTE — Telephone Encounter (Signed)
Copied from Edie. Topic: Appointment Scheduling - Scheduling Inquiry for Clinic >> Aug 13, 2018 12:59 PM Rayann Heman wrote: Reason for CRM: pt called to have NP appointment rescheduled. Please advise

## 2018-08-16 NOTE — Progress Notes (Signed)
Patricia Ramos is a 73 y.o. female is here to [facility].   Patient Care Team: Briscoe Deutscher, DO as PCP - General (Family Medicine) Inda Castle, MD (Inactive) (Gastroenterology) Vickey Huger, MD (Orthopedic Surgery)   History of Present Illness:   HPI: See Assessment and Plan section for Problem Based Charting of issues discussed today.   Health Maintenance Due  Topic Date Due  . INFLUENZA VACCINE  08/28/2018   Depression screen PHQ 2/9 12/21/2017  Decreased Interest 0  Down, Depressed, Hopeless 0  PHQ - 2 Score 0  Altered sleeping -  Tired, decreased energy -  Change in appetite -  Feeling bad or failure about yourself  -  Trouble concentrating -  Moving slowly or fidgety/restless -  Suicidal thoughts -  PHQ-9 Score -    PMHx, SurgHx, SocialHx, Medications, and Allergies were reviewed in the Visit Navigator and updated as appropriate.   Past Medical History:  Diagnosis Date  . Anxiety   . GERD   . Personal history of colonic polyps, hyperplastic      Past Surgical History:  Procedure Laterality Date  . CARPAL TUNNEL RELEASE Left   . REPLACEMENT TOTAL KNEE Right 2008  . TUBAL LIGATION       Family History  Problem Relation Age of Onset  . Hypertension Mother   . Breast cancer Mother 75  . Hypertension Father   . Diabetes Father   . Hypertension Brother   . Hypertension Brother   . Hypertension Brother   . Colon cancer Neg Hx   . Esophageal cancer Neg Hx   . Rectal cancer Neg Hx   . Stomach cancer Neg Hx     Social History   Tobacco Use  . Smoking status: Never Smoker  . Smokeless tobacco: Never Used  . Tobacco comment: Divorced-lives with 73 yo g-dtr. retired Quarry manager, working at [employer] since 2011  Substance Use Topics  . Alcohol use: No    Alcohol/week: 0.0 standard drinks  . Drug use: No    Current Medications and Allergies   .  Cholecalciferol (VITAMIN D3) 50000 units TABS, Take 1 tablet by mouth daily., Disp: , Rfl:   .  ibuprofen (ADVIL,MOTRIN) 600 MG tablet, Take 1 tablet (600 mg total) by mouth every 8 (eight) hours as needed., Disp: 50 tablet, Rfl: 3 .  mometasone (ELOCON) 0.1 % cream, APPLY TO AFFECTED AREA AS NEEDED, Disp: 45 g, Rfl: 3 .  Red Yeast Rice 600 MG CAPS, Take 1 capsule (600 mg total) by mouth daily., Disp: , Rfl:     Allergies  Allergen Reactions  . Codeine Itching    Takes it with Benadryl and tolerates it well.  Marland Kitchen Penicillins Rash   Review of Systems   Pertinent items are noted in the HPI. Otherwise, a complete ROS is negative.  Vitals   Vitals:   08/17/18 0904  BP: (!) 150/98  Pulse: 81  Temp: 98.4 F (36.9 C)  TempSrc: Oral  SpO2: 96%  Weight: 217 lb 8 oz (98.7 kg)  Height: 5\' 6"  (1.676 m)     Body mass index is 35.11 kg/m.  Physical Exam   Physical Exam Vitals signs and nursing note reviewed.  HENT:     Head: Normocephalic and atraumatic.  Eyes:     Pupils: Pupils are equal, round, and reactive to light.  Neck:     Musculoskeletal: Normal range of motion and neck supple.  Cardiovascular:     Rate  and Rhythm: Normal rate and regular rhythm.     Heart sounds: Normal heart sounds.  Pulmonary:     Effort: Pulmonary effort is normal.  Abdominal:     Palpations: Abdomen is soft.  Skin:    General: Skin is warm.  Psychiatric:        Behavior: Behavior normal.    Assessment and Plan   GERD, Rx Dexilant Previously controlled on "the blue pill." Now, uncontrolled. Okay with starting new medication today. Discussed EGD for long standing reflux.   Morbid obesity ([place]) Wt Readings from Last 3 Encounters:  08/17/18 217 lb 8 oz (98.7 kg)  12/21/17 207 lb (93.9 kg)  08/19/17 214 lb 3.2 oz (97.2 kg)   Reviewed healthy food choices and exercise.  Hyperlipidemia Lab Results  Component Value Date   CHOL 220 (H) 08/17/2018   HDL 59.80 08/17/2018   LDLCALC 147 (H) 08/17/2018   LDLDIRECT 155 (H) 02/27/2017   TRIG 65.0 08/17/2018   CHOLHDL 4 08/17/2018     Taking red yeast rice > 3 months at time of visit. Previously, taking Zocor.   The 10-year ASCVD risk score Mikey Bussing DC Brooke Bonito., et al., 2013) is: 19.3%   Values used to calculate the score:     Age: 89 years     Sex: Female     Is Non-Hispanic African American: Yes     Diabetic: No     Tobacco smoker: No     Systolic Blood Pressure: 540 mmHg     Is BP treated: No     HDL Cholesterol: 59.8 mg/dL     Total Cholesterol: 220 mg/dL   Morning headache Concerning for OSA. Discussed sleep study. Okay for referral.  Malaise and fatigue Reviewed history, lifestyle, current symptoms. Refer to Sleep. Labs today.   Insomnia Currently, taking Benadryl prn.  Orders Placed This Encounter  Procedures  . Comprehensive metabolic panel  . CBC with Differential/Platelet  . Lipid panel  . Iron, TIBC and Ferritin Panel  . Vitamin B12  . VITAMIN D 25 Hydroxy (Vit-D Deficiency, Fractures)  . TSH  . Ambulatory referral to Sleep Studies   Meds ordered this encounter  Medications  . dexlansoprazole (DEXILANT) 60 MG capsule    Sig: Take 1 capsule (60 mg total) by mouth daily.    Dispense:  30 capsule    Refill:  3   . Orders and follow up as documented in [place], reviewed diet, exercise and weight control, cardiovascular risk and specific lipid/LDL goals reviewed, reviewed medications and side effects in detail.  . Reviewed expectations re: course of current medical issues. . Outlined signs and symptoms indicating need for more acute intervention. . Patient verbalized understanding and all questions were answered. . Patient received an After Visit Summary.  Briscoe Deutscher, DO [place], [facility] 08/29/2018

## 2018-08-16 NOTE — Telephone Encounter (Signed)
Yes.  Thank you.

## 2018-08-16 NOTE — Telephone Encounter (Signed)
Pt decided not to cancel

## 2018-08-17 ENCOUNTER — Encounter: Payer: Self-pay | Admitting: Family Medicine

## 2018-08-17 ENCOUNTER — Other Ambulatory Visit: Payer: Self-pay

## 2018-08-17 ENCOUNTER — Ambulatory Visit (INDEPENDENT_AMBULATORY_CARE_PROVIDER_SITE_OTHER): Payer: Medicare Other | Admitting: Family Medicine

## 2018-08-17 VITALS — BP 150/98 | HR 81 | Temp 98.4°F | Ht 66.0 in | Wt 217.5 lb

## 2018-08-17 DIAGNOSIS — R519 Headache, unspecified: Secondary | ICD-10-CM

## 2018-08-17 DIAGNOSIS — Z86010 Personal history of colonic polyps: Secondary | ICD-10-CM

## 2018-08-17 DIAGNOSIS — R5383 Other fatigue: Secondary | ICD-10-CM | POA: Diagnosis not present

## 2018-08-17 DIAGNOSIS — E782 Mixed hyperlipidemia: Secondary | ICD-10-CM

## 2018-08-17 DIAGNOSIS — M1711 Unilateral primary osteoarthritis, right knee: Secondary | ICD-10-CM

## 2018-08-17 DIAGNOSIS — F5101 Primary insomnia: Secondary | ICD-10-CM

## 2018-08-17 DIAGNOSIS — R5381 Other malaise: Secondary | ICD-10-CM

## 2018-08-17 DIAGNOSIS — R51 Headache: Secondary | ICD-10-CM

## 2018-08-17 DIAGNOSIS — K219 Gastro-esophageal reflux disease without esophagitis: Secondary | ICD-10-CM | POA: Diagnosis not present

## 2018-08-17 DIAGNOSIS — M5136 Other intervertebral disc degeneration, lumbar region: Secondary | ICD-10-CM

## 2018-08-17 DIAGNOSIS — E559 Vitamin D deficiency, unspecified: Secondary | ICD-10-CM | POA: Diagnosis not present

## 2018-08-17 DIAGNOSIS — Z9189 Other specified personal risk factors, not elsewhere classified: Secondary | ICD-10-CM

## 2018-08-17 LAB — COMPREHENSIVE METABOLIC PANEL
ALT: 16 U/L (ref 0–35)
AST: 19 U/L (ref 0–37)
Albumin: 4.4 g/dL (ref 3.5–5.2)
Alkaline Phosphatase: 60 U/L (ref 39–117)
BUN: 15 mg/dL (ref 6–23)
CO2: 30 mEq/L (ref 19–32)
Calcium: 9.4 mg/dL (ref 8.4–10.5)
Chloride: 107 mEq/L (ref 96–112)
Creatinine, Ser: 0.59 mg/dL (ref 0.40–1.20)
GFR: 120.96 mL/min (ref >60.00)
Glucose, Bld: 92 mg/dL (ref 70–99)
Potassium: 4.3 mEq/L (ref 3.5–5.1)
Sodium: 144 mEq/L (ref 135–145)
Total Bilirubin: 0.3 mg/dL (ref 0.2–1.2)
Total Protein: 6.5 g/dL (ref 6.0–8.3)

## 2018-08-17 LAB — CBC WITH DIFFERENTIAL/PLATELET
Basophils Absolute: 0 10*3/uL (ref 0.0–0.1)
Basophils Relative: 0.6 % (ref 0.0–3.0)
Eosinophils Absolute: 0.1 10*3/uL (ref 0.0–0.7)
Eosinophils Relative: 2.1 % (ref 0.0–5.0)
HCT: 41 % (ref 36.0–46.0)
Hemoglobin: 13.4 g/dL (ref 12.0–15.0)
Lymphocytes Relative: 32.7 % (ref 12.0–46.0)
Lymphs Abs: 1.6 10*3/uL (ref 0.7–4.0)
MCHC: 32.7 g/dL (ref 30.0–36.0)
MCV: 87 fl (ref 78.0–100.0)
Monocytes Absolute: 0.3 10*3/uL (ref 0.1–1.0)
Monocytes Relative: 6.7 % (ref 3.0–12.0)
Neutro Abs: 2.8 10*3/uL (ref 1.4–7.7)
Neutrophils Relative %: 57.9 % (ref 43.0–77.0)
Platelets: 312 10*3/uL (ref 150.0–400.0)
RBC: 4.72 Mil/uL (ref 3.87–5.11)
RDW: 13.3 % (ref 11.5–15.5)
WBC: 4.8 10*3/uL (ref 4.0–10.5)

## 2018-08-17 LAB — LIPID PANEL
Cholesterol: 220 mg/dL — ABNORMAL HIGH (ref 0–200)
HDL: 59.8 mg/dL (ref >39.00)
LDL Cholesterol: 147 mg/dL — ABNORMAL HIGH (ref 0–99)
NonHDL: 159.84
Total CHOL/HDL Ratio: 4
Triglycerides: 65 mg/dL (ref 0.0–149.0)
VLDL: 13 mg/dL (ref 0.0–40.0)

## 2018-08-17 LAB — VITAMIN D 25 HYDROXY (VIT D DEFICIENCY, FRACTURES): VITD: 36.07 ng/mL (ref 30.00–100.00)

## 2018-08-17 LAB — TSH: TSH: 0.67 u[IU]/mL (ref 0.35–4.50)

## 2018-08-17 LAB — VITAMIN B12: Vitamin B-12: 333 pg/mL (ref 211–911)

## 2018-08-17 MED ORDER — DEXLANSOPRAZOLE 60 MG PO CPDR: 60.0000 mg | DELAYED_RELEASE_CAPSULE | Freq: Every day | ORAL | 3 refills | Status: AC

## 2018-08-18 LAB — IRON,TIBC AND FERRITIN PANEL
%SAT: 29 % (calc) (ref 16–45)
Ferritin: 52 ng/mL (ref 16–288)
Iron: 98 ug/dL (ref 45–160)
TIBC: 340 mcg/dL (calc) (ref 250–450)

## 2018-08-26 DIAGNOSIS — R5383 Other fatigue: Secondary | ICD-10-CM | POA: Insufficient documentation

## 2018-08-26 DIAGNOSIS — R5381 Other malaise: Secondary | ICD-10-CM | POA: Insufficient documentation

## 2018-08-26 DIAGNOSIS — R519 Headache, unspecified: Secondary | ICD-10-CM | POA: Insufficient documentation

## 2018-08-29 ENCOUNTER — Encounter: Payer: Self-pay | Admitting: Family Medicine

## 2018-08-29 DIAGNOSIS — M5136 Other intervertebral disc degeneration, lumbar region: Secondary | ICD-10-CM | POA: Insufficient documentation

## 2018-08-29 DIAGNOSIS — Z860100 Personal history of colon polyps, unspecified: Secondary | ICD-10-CM | POA: Insufficient documentation

## 2018-08-29 DIAGNOSIS — Z9189 Other specified personal risk factors, not elsewhere classified: Secondary | ICD-10-CM | POA: Insufficient documentation

## 2018-08-29 NOTE — Assessment & Plan Note (Signed)
Previously controlled on "the blue pill." Now, uncontrolled. Okay with starting new medication today. Discussed EGD for long standing reflux.

## 2018-08-29 NOTE — Assessment & Plan Note (Signed)
Currently, taking Benadryl prn.

## 2018-08-29 NOTE — Assessment & Plan Note (Signed)
Concerning for OSA. Discussed sleep study. Okay for referral.

## 2018-08-29 NOTE — Assessment & Plan Note (Signed)
Wt Readings from Last 3 Encounters:  08/17/18 217 lb 8 oz (98.7 kg)  12/21/17 207 lb (93.9 kg)  08/19/17 214 lb 3.2 oz (97.2 kg)   Reviewed healthy food choices and exercise.

## 2018-08-29 NOTE — Assessment & Plan Note (Signed)
Lab Results  Component Value Date   CHOL 220 (H) 08/17/2018   HDL 59.80 08/17/2018   LDLCALC 147 (H) 08/17/2018   LDLDIRECT 155 (H) 02/27/2017   TRIG 65.0 08/17/2018   CHOLHDL 4 08/17/2018   Taking red yeast rice > 3 months at time of visit. Previously, taking Zocor.   The 10-year ASCVD risk score Mikey Bussing DC Brooke Bonito., et al., 2013) is: 19.3%   Values used to calculate the score:     Age: 73 years     Sex: Female     Is Non-Hispanic African American: Yes     Diabetic: No     Tobacco smoker: No     Systolic Blood Pressure: 638 mmHg     Is BP treated: No     HDL Cholesterol: 59.8 mg/dL     Total Cholesterol: 220 mg/dL

## 2018-08-29 NOTE — Assessment & Plan Note (Signed)
Reviewed history, lifestyle, current symptoms. Refer to Sleep. Labs today.

## 2018-09-01 ENCOUNTER — Other Ambulatory Visit: Payer: Self-pay

## 2018-09-01 DIAGNOSIS — E782 Mixed hyperlipidemia: Secondary | ICD-10-CM

## 2018-09-06 ENCOUNTER — Other Ambulatory Visit: Payer: Self-pay

## 2018-09-06 DIAGNOSIS — Z20822 Contact with and (suspected) exposure to covid-19: Secondary | ICD-10-CM

## 2018-09-07 LAB — NOVEL CORONAVIRUS, NAA: SARS-CoV-2, NAA: NOT DETECTED

## 2018-09-16 ENCOUNTER — Other Ambulatory Visit: Payer: Self-pay

## 2018-09-16 ENCOUNTER — Ambulatory Visit (INDEPENDENT_AMBULATORY_CARE_PROVIDER_SITE_OTHER): Payer: Medicare Other | Admitting: Internal Medicine

## 2018-09-16 ENCOUNTER — Encounter: Payer: Self-pay | Admitting: Internal Medicine

## 2018-09-16 VITALS — BP 128/84 | HR 88 | Temp 97.3°F | Ht 66.0 in | Wt 219.0 lb

## 2018-09-16 DIAGNOSIS — E668 Other obesity: Secondary | ICD-10-CM

## 2018-09-16 DIAGNOSIS — E669 Obesity, unspecified: Secondary | ICD-10-CM

## 2018-09-16 DIAGNOSIS — E782 Mixed hyperlipidemia: Secondary | ICD-10-CM | POA: Diagnosis not present

## 2018-09-16 MED ORDER — ATORVASTATIN CALCIUM 20 MG PO TABS: 20.0000 mg | ORAL_TABLET | Freq: Every day | ORAL | 3 refills | Status: AC

## 2018-09-16 NOTE — Progress Notes (Signed)
LIPID CLINIC CONSULT NOTE  Chief Complaint:  Lipid management  Primary Care Physician: Briscoe Deutscher, DO  Primary Cardiologist:  No primary care provider on file.  HPI:  Patricia Ramos is a 73 y.o. female who is being seen today for the evaluation of lipid management at the request of Briscoe Deutscher, DO.  This is a pleasant 73 year old female with a history of anxiety and reflux but no significant cardiac issues.  She has no history of diabetes or hypertension.  She is overweight.  She has a history of dyslipidemia with LDL that is however between 140 and 150.  She was recommended to go on atorvastatin 20 mg daily however she reports variable compliance on that.  At one point she was on Zocor however thought she might have side effects with it.  She was then told to take red yeast rice in addition to the atorvastatin.  While taking that she thought she had more muscle aches particularly in her legs.  She also has a variable diet but seems to "eat everything she wants".  There is an increase in saturated fats although she does not eat fried foods on a regular basis.  She does eat meats and other sources of cholesterol.  She denies any chest pain or worsening shortness of breath.  There is no family history of early heart disease.  PMHx:  Past Medical History:  Diagnosis Date  . Anxiety   . GERD   . Personal history of colonic polyps, hyperplastic     Past Surgical History:  Procedure Laterality Date  . CARPAL TUNNEL RELEASE Left   . REPLACEMENT TOTAL KNEE Right 2008  . TUBAL LIGATION      FAMHx:  Family History  Problem Relation Age of Onset  . Hypertension Mother   . Breast cancer Mother 55  . Hypertension Father   . Diabetes Father   . Hypertension Brother   . Hypertension Brother   . Hypertension Brother   . Colon cancer Neg Hx   . Esophageal cancer Neg Hx   . Rectal cancer Neg Hx   . Stomach cancer Neg Hx     SOCHx:   reports that she has never smoked. She has  never used smokeless tobacco. She reports that she does not drink alcohol or use drugs.  ALLERGIES:  Allergies  Allergen Reactions  . Codeine Itching    Takes it with Benadryl and tolerates it well.  Marland Kitchen Penicillins Rash    ROS: Pertinent items noted in HPI and remainder of comprehensive ROS otherwise negative.  HOME MEDS: Current Outpatient Medications on File Prior to Visit  Medication Sig Dispense Refill  . Cholecalciferol (VITAMIN D3) 50000 units TABS Take 1 tablet by mouth daily.    Marland Kitchen dexlansoprazole (DEXILANT) 60 MG capsule Take 1 capsule (60 mg total) by mouth daily. 30 capsule 3  . ibuprofen (ADVIL,MOTRIN) 600 MG tablet Take 1 tablet (600 mg total) by mouth every 8 (eight) hours as needed. 50 tablet 3  . mometasone (ELOCON) 0.1 % cream APPLY TO AFFECTED AREA AS NEEDED 45 g 3   No current facility-administered medications on file prior to visit.     LABS/IMAGING: No results found for this or any previous visit (from the past 48 hour(s)). No results found.  LIPID PANEL:    Component Value Date/Time   CHOL 220 (H) 08/17/2018 1003   CHOL 232 (H) 08/19/2017 1543   TRIG 65.0 08/17/2018 1003   HDL 59.80 08/17/2018 1003  HDL 57 08/19/2017 1543   CHOLHDL 4 08/17/2018 1003   VLDL 13.0 08/17/2018 1003   LDLCALC 147 (H) 08/17/2018 1003   LDLCALC 147 (H) 08/19/2017 1543   LDLDIRECT 155 (H) 02/27/2017 1018   LDLDIRECT 132.1 12/04/2011 0837    WEIGHTS: Wt Readings from Last 3 Encounters:  09/16/18 219 lb (99.3 kg)  08/17/18 217 lb 8 oz (98.7 kg)  12/21/17 207 lb (93.9 kg)    VITALS: BP 128/84   Pulse 88   Temp (!) 97.3 F (36.3 C) (Temporal)   Ht 5\' 6"  (1.676 m)   Wt 219 lb (99.3 kg)   BMI 35.35 kg/m   EXAM: General appearance: alert, no distress and moderately obese Neck: no carotid bruit, no JVD and thyroid not enlarged, symmetric, no tenderness/mass/nodules Lungs: clear to auscultation bilaterally Heart: regular rate and rhythm Abdomen: soft, non-tender;  bowel sounds normal; no masses,  no organomegaly Extremities: extremities normal, atraumatic, no cyanosis or edema Pulses: 2+ and symmetric Skin: Skin color, texture, turgor normal. No rashes or lesions Neurologic: Grossly normal Psych: Pleasant  EKG: Deferred  ASSESSMENT: 1. 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 Moderate obesity  PLAN: 1.   [name] has a mixed dyslipidemia with goal LDL less than 100.  I agree with atorvastatin 20 however she has not been compliant with it.  She more regularly takes red yeast rice which could lower LDL cholesterol by 5 to 10% but is less potent and of course less likely to cause side effects.  It is not advised use red yeast rice in combination with a statin due to increased risk of myalgias.  I recommend she discontinue the red yeast rice and try to take the atorvastatin 20 mg nightly regularly.  She is to contact us if she has side effects which typically occur 2 to 4 weeks after starting it.  If it is well-tolerated, she should continue it and make the necessary dietary changes.  We provide her with dietary information today to lower cholesterol.  Plan follow-up with me in about 3 months with a lipid profile.  Thanks again for the kind referral.  Pixie Casino, MD, FACC, [place] Director of the [facility] Lipid [facility] &  Cardiovascular Risk Reduction Clinic Diplomate of the American Board of Clinical Lipidology Attending Cardiologist  Direct Dial: [phone]  Fax: [phone]  Website:  [email or web address] 09/16/2018, 8:43 AM

## 2018-09-16 NOTE — Patient Instructions (Addendum)
Medication Instructions:  STOP red yeast rice CONTINUE atorvastatin 20mg  daily CONTINUE other medications If you need a refill on your cardiac medications before your next appointment, please call your pharmacy.   Lab work: FASTING lab work in 3 months to check cholesterol - please complete about 1 week prior to your next visit with Dr. Debara Pickett. You can have the blood work done at Dr. Alcario Drought office or Dr. Lysbeth Penner office.   Testing/Procedures: NONE needed  Follow-Up: Dr. Debara Pickett recommends that you schedule a follow up visit with him the in the [facility] in 3 months. Please have fasting blood work about 1 week prior to this visit and he will review the blood work results with you at your appointment.

## 2018-09-16 NOTE — Addendum Note (Signed)
Addended by: Pixie Casino on: 09/16/2018 08:46 AM   Modules accepted: Level of Service

## 2018-09-20 ENCOUNTER — Institutional Professional Consult (permissible substitution): Payer: Medicare Other | Admitting: Neurology

## 2018-09-29 ENCOUNTER — Other Ambulatory Visit: Payer: Self-pay

## 2018-09-29 ENCOUNTER — Ambulatory Visit (INDEPENDENT_AMBULATORY_CARE_PROVIDER_SITE_OTHER): Payer: Medicare Other

## 2018-09-29 ENCOUNTER — Encounter: Payer: Self-pay | Admitting: Family Medicine

## 2018-09-29 DIAGNOSIS — Z0000 Encounter for general adult medical examination without abnormal findings: Secondary | ICD-10-CM

## 2018-09-29 DIAGNOSIS — Z23 Encounter for immunization: Secondary | ICD-10-CM

## 2018-09-29 DIAGNOSIS — Z1239 Encounter for other screening for malignant neoplasm of breast: Secondary | ICD-10-CM

## 2018-09-29 NOTE — Patient Instructions (Signed)
Ms. Patricia Ramos , Thank you for taking time to come for your Medicare Wellness Visit. I appreciate your ongoing commitment to your health goals. Please review the following plan we discussed and let me know if I can assist you in the future.   Screening recommendations/referrals: Colorectal Screening: up to date; last 08/15/15  Mammogram: up to date last; 12/08/17 Bone Density:   Vision and Dental Exams: Recommended annual ophthalmology exams for early detection of glaucoma and other disorders of the eye Recommended annual dental exams for proper oral hygiene  Vaccinations: Influenza vaccine: received today Pneumococcal vaccine: up to date last; 01/29/17 Tdap vaccine: up to date; last 08/19/17  Shingles vaccine: Please call your insurance company to determine your out of pocket expense for the Shingrix vaccine. You may receive this vaccine at your local pharmacy.  Advanced directives: Please bring a copy of your POA (Power of Attorney) and/or Living Will to your next appointment.  Goals: Recommend to drink at least 6-8 8oz glasses of water per day and to decrease portion sizes by eating 3 small healthy meals and at least 2 healthy snacks per day.  Next appointment: Please schedule your Annual Wellness Visit with your Nurse Health Advisor in one year.  Preventive Care 80 Years and Older, Female Preventive care refers to lifestyle choices and visits with your health care provider that can promote health and wellness. What does preventive care include?  A yearly physical exam. This is also called an annual well check.  Dental exams once or twice a year.  Routine eye exams. Ask your health care provider how often you should have your eyes checked.  Personal lifestyle choices, including:  Daily care of your teeth and gums.  Regular physical activity.  Eating a healthy diet.  Avoiding tobacco and drug use.  Limiting alcohol use.  Practicing safe sex.  Taking low-dose aspirin every  day if recommended by your health care provider.  Taking vitamin and mineral supplements as recommended by your health care provider. What happens during an annual well check? The services and screenings done by your health care provider during your annual well check will depend on your age, overall health, lifestyle risk factors, and family history of disease. Counseling  Your health care provider may ask you questions about your:  Alcohol use.  Tobacco use.  Drug use.  Emotional well-being.  Home and relationship well-being.  Sexual activity.  Eating habits.  History of falls.  Memory and ability to understand (cognition).  Work and work Statistician.  Reproductive health. Screening  You may have the following tests or measurements:  Height, weight, and BMI.  Blood pressure.  Lipid and cholesterol levels. These may be checked every 5 years, or more frequently if you are over 46 years old.  Skin check.  Lung cancer screening. You may have this screening every year starting at age 19 if you have a 30-pack-year history of smoking and currently smoke or have quit within the past 15 years.  Fecal occult blood test (FOBT) of the stool. You may have this test every year starting at age 38.  Flexible sigmoidoscopy or colonoscopy. You may have a sigmoidoscopy every 5 years or a colonoscopy every 10 years starting at age 16.  Hepatitis C blood test.  Hepatitis B blood test.  Sexually transmitted disease (STD) testing.  Diabetes screening. This is done by checking your blood sugar (glucose) after you have not eaten for a while (fasting). You may have this done every 1-3 years.  Bone density scan. This is done to screen for osteoporosis. You may have this done starting at age 41.  Mammogram. This may be done every 1-2 years. Talk to your health care provider about how often you should have regular mammograms. Talk with your health care provider about your test results,  treatment options, and if necessary, the need for more tests. Vaccines  Your health care provider may recommend certain vaccines, such as:  Influenza vaccine. This is recommended every year.  Tetanus, diphtheria, and acellular pertussis (Tdap, Td) vaccine. You may need a Td booster every 10 years.  Zoster vaccine. You may need this after age 59.  Pneumococcal 13-valent conjugate (PCV13) vaccine. One dose is recommended after age 44.  Pneumococcal polysaccharide (PPSV23) vaccine. One dose is recommended after age 50. Talk to your health care provider about which screenings and vaccines you need and how often you need them. This information is not intended to replace advice given to you by your health care provider. Make sure you discuss any questions you have with your health care provider. Document Released: 02/09/2015 Document Revised: 10/03/2015 Document Reviewed: 11/14/2014 Elsevier Interactive Patient Education  2017 Wall Prevention in the Home Falls can cause injuries. They can happen to people of all ages. There are many things you can do to make your home safe and to help prevent falls. What can I do on the outside of my home?  Regularly fix the edges of walkways and driveways and fix any cracks.  Remove anything that might make you trip as you walk through a door, such as a raised step or threshold.  Trim any bushes or trees on the path to your home.  Use bright outdoor lighting.  Clear any walking paths of anything that might make someone trip, such as rocks or tools.  Regularly check to see if handrails are loose or broken. Make sure that both sides of any steps have handrails.  Any raised decks and porches should have guardrails on the edges.  Have any leaves, snow, or ice cleared regularly.  Use sand or salt on walking paths during winter.  Clean up any spills in your garage right away. This includes oil or grease spills. What can I do in the  bathroom?  Use night lights.  Install grab bars by the toilet and in the tub and shower. Do not use towel bars as grab bars.  Use non-skid mats or decals in the tub or shower.  If you need to sit down in the shower, use a plastic, non-slip stool.  Keep the floor dry. Clean up any water that spills on the floor as soon as it happens.  Remove soap buildup in the tub or shower regularly.  Attach bath mats securely with double-sided non-slip rug tape.  Do not have throw rugs and other things on the floor that can make you trip. What can I do in the bedroom?  Use night lights.  Make sure that you have a light by your bed that is easy to reach.  Do not use any sheets or blankets that are too big for your bed. They should not hang down onto the floor.  Have a firm chair that has side arms. You can use this for support while you get dressed.  Do not have throw rugs and other things on the floor that can make you trip. What can I do in the kitchen?  Clean up any spills right away.  Avoid walking  on wet floors.  Keep items that you use a lot in easy-to-reach places.  If you need to reach something above you, use a strong step stool that has a grab bar.  Keep electrical cords out of the way.  Do not use floor polish or wax that makes floors slippery. If you must use wax, use non-skid floor wax.  Do not have throw rugs and other things on the floor that can make you trip. What can I do with my stairs?  Do not leave any items on the stairs.  Make sure that there are handrails on both sides of the stairs and use them. Fix handrails that are broken or loose. Make sure that handrails are as long as the stairways.  Check any carpeting to make sure that it is firmly attached to the stairs. Fix any carpet that is loose or worn.  Avoid having throw rugs at the top or bottom of the stairs. If you do have throw rugs, attach them to the floor with carpet tape.  Make sure that you have a  light switch at the top of the stairs and the bottom of the stairs. If you do not have them, ask someone to add them for you. What else can I do to help prevent falls?  Wear shoes that:  Do not have high heels.  Have rubber bottoms.  Are comfortable and fit you well.  Are closed at the toe. Do not wear sandals.  If you use a stepladder:  Make sure that it is fully opened. Do not climb a closed stepladder.  Make sure that both sides of the stepladder are locked into place.  Ask someone to hold it for you, if possible.  Clearly mark and make sure that you can see:  Any grab bars or handrails.  First and last steps.  Where the edge of each step is.  Use tools that help you move around (mobility aids) if they are needed. These include:  Canes.  Walkers.  Scooters.  Crutches.  Turn on the lights when you go into a dark area. Replace any light bulbs as soon as they burn out.  Set up your furniture so you have a clear path. Avoid moving your furniture around.  If any of your floors are uneven, fix them.  If there are any pets around you, be aware of where they are.  Review your medicines with your doctor. Some medicines can make you feel dizzy. This can increase your chance of falling. Ask your doctor what other things that you can do to help prevent falls. This information is not intended to replace advice given to you by your health care provider. Make sure you discuss any questions you have with your health care provider. Document Released: 11/09/2008 Document Revised: 06/21/2015 Document Reviewed: 02/17/2014 Elsevier Interactive Patient Education  2017 [employer].

## 2018-09-29 NOTE — Progress Notes (Signed)
Subjective:   Patricia Ramos is a 73 y.o. female who presents for Medicare Annual (Subsequent) preventive examination.  Review of Systems:   Cardiac Risk Factors include: advanced age (>31men, >110 women);dyslipidemia;sedentary lifestyle     Objective:     Vitals: BP 128/80 (BP Location: Left Arm, Patient Position: Sitting, Cuff Size: Large)   Temp 97.9 F (36.6 C) (Temporal)   Ht 5\' 6"  (1.676 m)   Wt 219 lb 3.2 oz (99.4 kg)   BMI 35.38 kg/m   Body mass index is 35.38 kg/m.  Advanced Directives 09/29/2018 12/21/2017 08/19/2017 01/29/2017 12/11/2016 09/04/2016 06/19/2016  Does Patient Have a Medical Advance Directive? No No No No No No Yes  Would patient like information on creating a medical advance directive? Yes (MAU/Ambulatory/Procedural Areas - Information given) No - Patient declined No - Patient declined No - Patient declined No - Patient declined No - Patient declined No - Patient declined    Tobacco Social History   Tobacco Use  Smoking Status Never Smoker  Smokeless Tobacco Never Used      Clinical Intake:  Pre-visit preparation completed: Yes  Pain : No/denies pain  Diabetes: No  How often do you need to have someone help you when you read instructions, pamphlets, or other written materials from your doctor or pharmacy?: 2 - Rarely  Interpreter Needed?: No  Information entered by :: Denman George LPN  Past Medical History:  Diagnosis Date  . Anxiety   . GERD   . Personal history of colonic polyps, hyperplastic    Past Surgical History:  Procedure Laterality Date  . CARPAL TUNNEL RELEASE Left   . REPLACEMENT TOTAL KNEE Right 2008  . TUBAL LIGATION     Family History  Problem Relation Age of Onset  . Hypertension Mother   . Breast cancer Mother 29  . Hypertension Father   . Diabetes Father   . Hypertension Brother   . Hypertension Brother   . Hypertension Brother   . Colon cancer Neg Hx   . Esophageal cancer Neg Hx   . Rectal cancer Neg Hx    . Stomach cancer Neg Hx    Social History   Socioeconomic History  . Marital status: Divorced    Spouse name: Not on file  . Number of children: Not on file  . Years of education: Not on file  . Highest education level: Not on file  Occupational History  . Not on file  Social Needs  . Financial resource strain: Not on file  . Food insecurity    Worry: Not on file    Inability: Not on file  . Transportation needs    Medical: Not on file    Non-medical: Not on file  Tobacco Use  . Smoking status: Never Smoker  . Smokeless tobacco: Never Used  Substance and Sexual Activity  . Alcohol use: No    Alcohol/week: 0.0 standard drinks  . Drug use: No  . Sexual activity: Not on file  Lifestyle  . Physical activity    Days per week: Not on file    Minutes per session: Not on file  . Stress: Not on file  Relationships  . Social Herbalist on phone: Not on file    Gets together: Not on file    Attends religious service: Not on file    Active member of club or organization: Not on file    Attends meetings of clubs or organizations: Not on file  Relationship status: Not on file  Other Topics Concern  . Not on file  Social History Narrative   Divorced-lives with 73 yo g-dtr. retired [employer] manager   Does estate sales     Outpatient Encounter Medications as of 09/29/2018  Medication Sig  . atorvastatin (LIPITOR) 20 MG tablet Take 1 tablet (20 mg total) by mouth daily.  . Cholecalciferol (VITAMIN D3) 50000 units TABS Take 1 tablet by mouth daily.  Marland Kitchen dexlansoprazole (DEXILANT) 60 MG capsule Take 1 capsule (60 mg total) by mouth daily.  Marland Kitchen ibuprofen (ADVIL,MOTRIN) 600 MG tablet Take 1 tablet (600 mg total) by mouth every 8 (eight) hours as needed.  . mometasone (ELOCON) 0.1 % cream APPLY TO AFFECTED AREA AS NEEDED   No facility-administered encounter medications on file as of 09/29/2018.     Activities of Daily Living In your present state of health, do you have any  difficulty performing the following activities: 09/29/2018  Hearing? N  Vision? N  Difficulty concentrating or making decisions? N  Walking or climbing stairs? N  Dressing or bathing? N  Doing errands, shopping? N  Preparing Food and eating ? N  Using the Toilet? N  In the past six months, have you accidently leaked urine? N  Do you have problems with loss of bowel control? N  Managing your Medications? N  Managing your Finances? N  Housekeeping or managing your Housekeeping? N  Some recent data might be hidden    Patient Care Team: Briscoe Deutscher, DO as PCP - General (Family Medicine) Inda Castle, MD (Inactive) (Gastroenterology) Vickey Huger, MD (Orthopedic Surgery)    Assessment:   This is a routine wellness examination for [facility]  Exercise Activities and Dietary recommendations Current Exercise Habits: The patient does not participate in regular exercise at present  Goals    . DIET - EAT MORE FRUITS AND VEGETABLES     Patient would overall like to eat healthier        Fall Risk Fall Risk  09/29/2018 12/21/2017 08/19/2017 01/29/2017 12/11/2016  Falls in the past year? 1 0 No No No  Number falls in past yr: 1 - - - -  Injury with Fall? 0 - - - -  Risk for fall due to : History of fall(s) - - - -  Follow up Education provided - - - -   Is the patient's home free of loose throw rugs in walkways, pet beds, electrical cords, etc?   yes      Grab bars in the bathroom? yes      Handrails on the stairs?   yes      Adequate lighting?   yes  Timed Get Up and Go performed: completed and within normal timeframe   Depression Screen PHQ 2/9 Scores 09/29/2018 12/21/2017 08/19/2017 01/29/2017  PHQ - 2 Score 0 0 0 0  PHQ- 9 Score - - - -     Cognitive Function-no cognitive concerns at this time      6CIT Screen 09/29/2018  What Year? 0 points  What month? 0 points  What time? 0 points  Count back from 20 0 points  Months in reverse 0 points  Repeat phrase 0 points  Total  Score 0    Immunization History  Administered Date(s) Administered  . Influenza Split 10/28/2011  . Influenza Whole 11/17/2007, 01/01/2009  . Influenza, High Dose Seasonal PF 11/13/2016, 11/05/2017, 09/29/2018  . Influenza,inj,Quad PF,6+ Mos 10/22/2012  . Influenza-Unspecified 11/10/2016  . Pneumococcal Conjugate-13 07/16/2015  .  Pneumococcal Polysaccharide-23 01/29/2017  . Td 03/26/2007  . Tdap 08/19/2017  . Zoster 05/12/2008    Qualifies for Shingles Vaccine?Discussed and patient will check with pharmacy for coverage.  Patient education handout provided    Screening Tests Health Maintenance  Topic Date Due  . INFLUENZA VACCINE  08/28/2018  . MAMMOGRAM  12/09/2019  . COLONOSCOPY  08/14/2020  . TETANUS/TDAP  08/20/2027  . Hepatitis C Screening  Completed  . PNA vac Low Risk Adult  Completed    Cancer Screenings: Lung: Low Dose CT Chest recommended if Age 73-80 years, 30 pack-year currently smoking OR have quit w/in 15years. Patient does not qualify. Breast:  Up to date on Mammogram? Yes - ordered for this year  Up to date of Bone Density/Dexa? Patient declines  Colorectal: completed 08/15/15     Plan:    I have personally reviewed and addressed the Medicare Annual Wellness questionnaire and have noted the following in the patient's chart:  A. Medical and social history B. Use of alcohol, tobacco or illicit drugs  C. Current medications and supplements D. Functional ability and status E.  Nutritional status F.  Physical activity G. Advance directives H. List of other physicians I.  Hospitalizations, surgeries, and ER visits in previous 12 months J.  [place] such as hearing and vision if needed, cognitive and depression L. Referrals, records requested, and appointments- none   In addition, I have reviewed and discussed with patient certain preventive protocols, quality metrics, and best practice recommendations. A written personalized care plan for  preventive services as well as general preventive health recommendations were provided to patient.   Signed,  Denman George, LPN  Nurse Health Advisor   Nurse Notes: Patient states that she does not feel that Dexilant is helping with bowels.  She is still taking laxatives weekly.  Also states that medication is costly for her ($50 copay).  Would like to know if there are any other options or if she can just take OTC medication for GERD.  She does not want to see a specialist.  Please advise.

## 2018-10-06 NOTE — Progress Notes (Signed)
Flu vaccine information updated to correct manufacturer

## 2018-10-06 NOTE — Addendum Note (Signed)
Addended by: Denman George B on: 10/06/2018 02:56 PM   Modules accepted: Orders

## 2018-10-07 NOTE — Progress Notes (Signed)
I have reviewed documentation for AWV and Advance Care planning provided by [facility], I agree with documentation, I was immediately available for any questions. Markeesha Char, DO   

## 2018-11-11 ENCOUNTER — Ambulatory Visit: Payer: Medicare Other | Admitting: Internal Medicine

## 2018-12-27 ENCOUNTER — Telehealth: Payer: Self-pay | Admitting: Internal Medicine

## 2018-12-27 NOTE — Telephone Encounter (Signed)
Called to schedule patient's follow up LIPID clinic appointment.  She states she is not interested in scheduling this appointment

## 2019-01-19 ENCOUNTER — Emergency Department (HOSPITAL_COMMUNITY): Payer: Medicare Other

## 2019-01-19 ENCOUNTER — Other Ambulatory Visit: Payer: Self-pay

## 2019-01-19 ENCOUNTER — Encounter (HOSPITAL_COMMUNITY): Payer: Self-pay | Admitting: Emergency Medicine

## 2019-01-19 ENCOUNTER — Emergency Department (HOSPITAL_COMMUNITY)
Admission: EM | Admit: 2019-01-19 | Discharge: 2019-01-19 | Disposition: A | Discharge status: Home / Self Care | Payer: Medicare Other | Attending: Emergency Medicine | Admitting: Emergency Medicine

## 2019-01-19 DIAGNOSIS — Y92410 Unspecified street and highway as the place of occurrence of the external cause: Secondary | ICD-10-CM | POA: Insufficient documentation

## 2019-01-19 DIAGNOSIS — G44319 Acute post-traumatic headache, not intractable: Secondary | ICD-10-CM | POA: Diagnosis not present

## 2019-01-19 DIAGNOSIS — Y999 Unspecified external cause status: Secondary | ICD-10-CM | POA: Insufficient documentation

## 2019-01-19 DIAGNOSIS — Y93I9 Activity, other involving external motion: Secondary | ICD-10-CM | POA: Diagnosis not present

## 2019-01-19 DIAGNOSIS — R519 Headache, unspecified: Secondary | ICD-10-CM | POA: Diagnosis present

## 2019-01-19 MED ORDER — DEXAMETHASONE SODIUM PHOSPHATE 10 MG/ML IJ SOLN
10.0000 mg | Freq: Once | INTRAMUSCULAR | Status: AC
  Administered 2019-01-19: 10 mg via INTRAVENOUS
  Filled 2019-01-19: qty 1

## 2019-01-19 MED ORDER — DIPHENHYDRAMINE HCL 50 MG/ML IJ SOLN
25.0000 mg | Freq: Once | INTRAMUSCULAR | Status: AC
  Administered 2019-01-19: 12:00:00 25 mg via INTRAVENOUS
  Filled 2019-01-19: qty 1

## 2019-01-19 MED ORDER — METOCLOPRAMIDE HCL 5 MG/ML IJ SOLN
5.0000 mg | Freq: Once | INTRAMUSCULAR | Status: AC
  Administered 2019-01-19: 12:00:00 5 mg via INTRAVENOUS
  Filled 2019-01-19: qty 2
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
## 2019-01-19 NOTE — Discharge Instructions (Signed)
Your CT scan is reassuring.  Please use Tylenol to treat headaches and make sure you are drinking plenty of water.  Headaches may be related to postconcussion syndrome, or you could be having some rebound headaches from repeated medication use.  Follow-up if headaches are not improving.  Get help right away if: You have a very bad headache. You feel confused. You feel very sleepy. You pass out (faint). You throw up (vomit). You feel weak in any part of your body. You feel numb in any part of your body. You start shaking (have a seizure). You have trouble talking.

## 2019-03-02 ENCOUNTER — Ambulatory Visit: Payer: Medicare Other | Admitting: Family Medicine

## 2019-04-29 ENCOUNTER — Other Ambulatory Visit: Payer: Self-pay | Admitting: Family

## 2019-04-29 ENCOUNTER — Other Ambulatory Visit: Payer: Self-pay

## 2019-04-29 ENCOUNTER — Ambulatory Visit
Admission: RE | Admit: 2019-04-29 | Discharge: 2019-04-29 | Disposition: A | Discharge status: Home / Self Care | Payer: Medicare Other | Source: Ambulatory Visit | Attending: Family | Admitting: Family

## 2019-04-29 DIAGNOSIS — M545 Low back pain, unspecified: Secondary | ICD-10-CM

## 2019-05-09 ENCOUNTER — Other Ambulatory Visit: Payer: Medicare Other

## 2019-05-25 ENCOUNTER — Encounter: Payer: Medicare Other | Admitting: Family Medicine

## 2019-08-11 ENCOUNTER — Other Ambulatory Visit: Payer: Self-pay | Admitting: Family

## 2019-08-11 DIAGNOSIS — E2839 Other primary ovarian failure: Secondary | ICD-10-CM

## 2019-11-23 ENCOUNTER — Other Ambulatory Visit: Payer: Medicare Other

## 2019-12-13 ENCOUNTER — Other Ambulatory Visit: Payer: Medicare Other

## 2020-05-31 ENCOUNTER — Ambulatory Visit: Payer: Medicare Other | Admitting: Internal Medicine

## 2020-06-14 ENCOUNTER — Ambulatory Visit
Admission: RE | Admit: 2020-06-14 | Discharge: 2020-06-14 | Disposition: A | Discharge status: Home / Self Care | Payer: Medicare Other | Source: Ambulatory Visit | Attending: Family | Admitting: Family

## 2020-06-14 ENCOUNTER — Other Ambulatory Visit: Payer: Self-pay | Admitting: Family

## 2020-06-14 DIAGNOSIS — M25511 Pain in right shoulder: Secondary | ICD-10-CM

## 2020-08-18 ENCOUNTER — Encounter: Payer: Self-pay | Admitting: Gastroenterology

## 2021-02-04 LAB — COLOGUARD: COLOGUARD: NEGATIVE

## 2021-11-01 ENCOUNTER — Emergency Department (HOSPITAL_COMMUNITY)
Admission: EM | Admit: 2021-11-01 | Discharge: 2021-11-02 | Disposition: A | Discharge status: Home / Self Care | Payer: Medicare Other | Attending: Emergency Medicine | Admitting: Emergency Medicine

## 2021-11-01 ENCOUNTER — Emergency Department (HOSPITAL_COMMUNITY): Payer: Medicare Other

## 2021-11-01 ENCOUNTER — Encounter (HOSPITAL_COMMUNITY): Payer: Self-pay | Admitting: Emergency Medicine

## 2021-11-01 DIAGNOSIS — R112 Nausea with vomiting, unspecified: Secondary | ICD-10-CM | POA: Insufficient documentation

## 2021-11-01 DIAGNOSIS — R42 Dizziness and giddiness: Secondary | ICD-10-CM | POA: Diagnosis not present

## 2021-11-01 DIAGNOSIS — H538 Other visual disturbances: Secondary | ICD-10-CM | POA: Diagnosis not present

## 2021-11-01 DIAGNOSIS — R519 Headache, unspecified: Secondary | ICD-10-CM | POA: Diagnosis not present

## 2021-11-01 DIAGNOSIS — Z79899 Other long term (current) drug therapy: Secondary | ICD-10-CM | POA: Diagnosis not present

## 2021-11-01 DIAGNOSIS — I10 Essential (primary) hypertension: Secondary | ICD-10-CM | POA: Diagnosis not present

## 2021-11-01 LAB — BASIC METABOLIC PANEL
Anion gap: 9 (ref 5–15)
BUN: 13 mg/dL (ref 8–23)
CO2: 27 mmol/L (ref 22–32)
Calcium: 9.8 mg/dL (ref 8.9–10.3)
Chloride: 105 mmol/L (ref 98–111)
Creatinine, Ser: 0.63 mg/dL (ref 0.44–1.00)
GFR, Estimated: >60 mL/min (ref >60)
Glucose, Bld: 99 mg/dL (ref 70–99)
Potassium: 4 mmol/L (ref 3.5–5.1)
Sodium: 141 mmol/L (ref 135–145)

## 2021-11-01 LAB — CBC WITH DIFFERENTIAL/PLATELET
Abs Immature Granulocytes: 0.02 10*3/uL (ref 0.00–0.07)
Basophils Absolute: 0 10*3/uL (ref 0.0–0.1)
Basophils Relative: 1 %
Eosinophils Absolute: 0.1 10*3/uL (ref 0.0–0.5)
Eosinophils Relative: 1 %
HCT: 46.7 % — ABNORMAL HIGH (ref 36.0–46.0)
Hemoglobin: 14.7 g/dL (ref 12.0–15.0)
Immature Granulocytes: 0 %
Lymphocytes Relative: 21 %
Lymphs Abs: 1.7 10*3/uL (ref 0.7–4.0)
MCH: 28.2 pg (ref 26.0–34.0)
MCHC: 31.5 g/dL (ref 30.0–36.0)
MCV: 89.5 fL (ref 80.0–100.0)
Monocytes Absolute: 0.7 10*3/uL (ref 0.1–1.0)
Monocytes Relative: 8 %
Neutro Abs: 5.6 10*3/uL (ref 1.7–7.7)
Neutrophils Relative %: 69 %
Platelets: 297 10*3/uL (ref 150–400)
RBC: 5.22 MIL/uL — ABNORMAL HIGH (ref 3.87–5.11)
RDW: 12.5 % (ref 11.5–15.5)
WBC: 8.2 10*3/uL (ref 4.0–10.5)
nRBC: 0 % (ref 0.0–0.2)

## 2021-11-01 MED ORDER — MECLIZINE HCL 25 MG PO TABS
25.0000 mg | ORAL_TABLET | Freq: Once | ORAL | Status: AC
  Administered 2021-11-01: 25 mg via ORAL
  Filled 2021-11-01: qty 1

## 2021-11-01 MED ORDER — ONDANSETRON 4 MG PO TBDP
4.0000 mg | ORAL_TABLET | Freq: Once | ORAL | Status: AC
  Administered 2021-11-01: 4 mg via ORAL
  Filled 2021-11-01: qty 1

## 2021-11-01 NOTE — ED Provider Triage Note (Signed)
Emergency Medicine Provider Triage Evaluation Note  Patricia Ramos , a 76 y.o. female  was evaluated in triage.  Pt complains of headache/dizziness.  This morning around 9am started having dizziness/spinning.  Has n/v.  Has bitemporal headache.  Blurry vision.  No loss of vision.  No numbness or weakness.  Review of Systems  Positive: Headache, dizziness, n/v  Negative: [place]/weakness  Physical Exam  BP (!) 152/87 (BP Location: Left Arm)   Pulse 81   Temp 98.1 F (36.7 C) (Oral)   Resp 18   Ht 5' 6.5" (1.689 m)   Wt 97.1 kg   SpO2 97%   BMI 34.02 kg/m  Gen:   Awake, no distress    Resp:  Normal effort   MSK:   Moves extremities without difficulty   Other:   Mild nystagmus with component to rightward gaze.  No visual field deficits.  Motor/sensory intact  Medical Decision Making  Medically screening exam initiated at 6:50 PM.  Appropriate orders placed.  Cassell Clement was informed that the remainder of the evaluation will be completed by another provider, this initial triage assessment does not replace that evaluation, and the importance of remaining in the ED until their evaluation is complete.      Malvin Johns, MD 11/01/21 [phone]

## 2021-11-01 NOTE — ED Triage Notes (Signed)
Patient c/o headache with dizziness today. Denies weakness.

## 2021-11-02 ENCOUNTER — Encounter (HOSPITAL_COMMUNITY): Payer: Self-pay

## 2021-11-02 ENCOUNTER — Emergency Department (HOSPITAL_COMMUNITY): Payer: Medicare Other

## 2021-11-02 MED ORDER — METOCLOPRAMIDE HCL 5 MG/ML IJ SOLN
10.0000 mg | Freq: Once | INTRAMUSCULAR | Status: AC
  Administered 2021-11-02: 10 mg via INTRAVENOUS
  Filled 2021-11-02: qty 2

## 2021-11-02 MED ORDER — LORAZEPAM 2 MG/ML IJ SOLN
0.5000 mg | Freq: Once | INTRAMUSCULAR | Status: AC
  Administered 2021-11-02: 0.5 mg via INTRAVENOUS
  Filled 2021-11-02: qty 1

## 2021-11-02 MED ORDER — IOHEXOL 350 MG/ML SOLN
75.0000 mL | Freq: Once | INTRAVENOUS | Status: AC | PRN
  Administered 2021-11-02: 75 mL via INTRAVENOUS

## 2021-11-02 MED ORDER — METOCLOPRAMIDE HCL 10 MG PO TABS: 10.0000 mg | ORAL_TABLET | Freq: Four times a day (QID) | ORAL | 0 refills | Status: AC

## 2021-11-02 NOTE — ED Notes (Signed)
Patient transported to MRI 

## 2021-11-02 NOTE — Discharge Instructions (Addendum)
Your imaging was fortunately all negative today for stroke.  Unfortunately, this means I do not have an answer for what is causing your dizziness.  I would recommend that you follow-up with your PCP and they can decide whether you should go to a neurologist or an ear nose and throat doctor.  I have sent you in a prescription for Reglan which you noted did have some improvement for your vertigo last night.  I hope you get some good rest.

## 2021-11-02 NOTE — ED Provider Notes (Signed)
Care assumed from Ralene Bathe MD at shift change, please see their note for full detail, but in brief Patricia Ramos is a 76 y.o. female presents with ataxia and vertigo.  Plan: Pending MRI  Physical Exam  BP 113/73   Pulse 76   Temp 97.6 F (36.4 C) (Oral)   Resp 20   Ht 5' 6.5" (1.689 m)   Wt 97.1 kg   SpO2 96%   BMI 34.02 kg/m   Physical Exam  Procedures  Procedures  ED Course / MDM    Medical Decision Making Amount and/or Complexity of Data Reviewed Labs: ordered. Radiology: ordered.  Risk Prescription drug management.   On reassessment, patient is resting comfortably in bed in no acute distress.  MRI ordered by previous EDP was completed.  Results negative for acute stroke or infarct.  Discussed results with patient who is understanding.  We also had conversation unfortunately this means that we do not have a clear answer for what is causing some of her dizziness.  She states that she has had no improvement using meclizine in the past and does not want a refill of this.  She states that she was given some injectable medications last night which did seem to calm some of her symptoms down.  Per chart review, she was given Ativan and Reglan IV.  Advised that I can give her some Reglan if this did help her, however we will be unable to send her home with Ativan given that it is a controlled substance.  Advised her to follow-up with her PCP for further evaluation and patient is agreeable to this plan.  Discharged home in stable condition.       Tonye Pearson, [place] 11/02/21 1048    Hayden Rasmussen, MD 11/02/21 [phone]

## 2021-11-02 NOTE — ED Provider Notes (Signed)
[facility] DEPT Provider Note   CSN: [id number] Arrival date & time: 11/01/21  1833     History  Chief Complaint  Patient presents with   Headache    Patricia Ramos is a 76 y.o. female.  The history is provided by the patient, a relative and medical records.  Headache Patricia Ramos is a 76 y.o. female who presents to the Emergency Department complaining of dizzy.  She presents to the emergency department for evaluation of sudden onset dizziness that started at 9 AM.  She has associated bitemporal headache and nausea and vomiting.  She describes it as a spinning sensation.  She has been experiencing difficulty ambulating secondary to dizziness.  No prior similar symptoms.  Has a history of hypertension.  No tobacco, alcohol, drug use.  No history of TIA/CVA. She reports blurred vision.  No diplopia.     Home Medications Prior to Admission medications   Medication Sig Start Date End Date Taking? Authorizing Provider  acetaminophen (TYLENOL) 325 MG tablet Take 650 mg by mouth every 6 (six) hours as needed.   Yes [provider]  hydrochlorothiazide (HYDRODIURIL) 12.5 MG tablet Take 12.5 mg by mouth daily. 10/25/21  Yes [provider]  OMEPRAZOLE PO Take 1 tablet by mouth daily.   Yes [provider]  atorvastatin (LIPITOR) 20 MG tablet Take 1 tablet (20 mg total) by mouth daily. Patient not taking: Reported on 11/02/2021 09/16/18   Pixie Casino, MD  dexlansoprazole (DEXILANT) 60 MG capsule Take 1 capsule (60 mg total) by mouth daily. Patient not taking: Reported on 11/02/2021 08/17/18   Briscoe Deutscher, DO  mometasone (ELOCON) 0.1 % cream APPLY TO AFFECTED AREA AS NEEDED Patient not taking: Reported on 01/19/2019 12/18/17   Zenia Resides, MD      Allergies    Codeine and Penicillins    Review of Systems   Review of Systems  Neurological:  Positive for headaches.  All other systems reviewed and are  negative.   Physical Exam Updated Vital Signs BP (!) 129/99   Pulse 65   Temp 98.4 F (36.9 C) (Oral)   Resp 18   Ht 5' 6.5" (1.689 m)   Wt 97.1 kg   SpO2 100%   BMI 34.02 kg/m  Physical Exam Vitals and nursing note reviewed.  Constitutional:      Appearance: She is well-developed.  HENT:     Head: Normocephalic and atraumatic.  Cardiovascular:     Rate and Rhythm: Normal rate and regular rhythm.     Heart sounds: No murmur heard. Pulmonary:     Effort: Pulmonary effort is normal. No respiratory distress.     Breath sounds: Normal breath sounds.  Abdominal:     Palpations: Abdomen is soft.     Tenderness: There is no abdominal tenderness. There is no guarding or rebound.  Musculoskeletal:        General: No tenderness.  Skin:    General: Skin is warm and dry.  Neurological:     Mental Status: She is alert and oriented to person, place, and time.     Comments: No asymmetry of facial movements.  Visual fields grossly intact.  5 out of 5 strength in all 4 extremities with sensation to light touch intact in all 4 extremities.  Ataxic gait with falling to the right.  Psychiatric:        Behavior: Behavior normal.     ED Results / Procedures / Treatments  Labs (all labs ordered are listed, but only abnormal results are displayed) Labs Reviewed  CBC WITH DIFFERENTIAL/PLATELET - Abnormal; Notable for the following components:      Result Value   RBC 5.22 (*)    HCT 46.7 (*)    All other components within normal limits  BASIC METABOLIC PANEL    EKG None  Radiology CT Head Wo Contrast  Result Date: 11/01/2021 CLINICAL DATA:  Headache and dizziness. EXAM: CT HEAD WITHOUT CONTRAST TECHNIQUE: Contiguous axial images were obtained from the base of the skull through the vertex without intravenous contrast. RADIATION DOSE REDUCTION: This exam was performed according to the departmental dose-optimization program which includes automated exposure control, adjustment of the  mA and/or kV according to patient size and/or use of iterative reconstruction technique. COMPARISON:  January 19, 2019 FINDINGS: Brain: No evidence of acute infarction, hemorrhage, hydrocephalus, extra-axial collection or mass lesion/mass effect. Vascular: No hyperdense vessel or unexpected calcification. Skull: Normal. Negative for fracture or focal lesion. Sinuses/Orbits: No acute finding. Other: None. IMPRESSION: No acute intracranial pathology. Electronically Signed   By: Virgina Norfolk M.D.   On: 11/01/2021 20:30    Procedures Procedures    Medications Ordered in ED Medications  meclizine (ANTIVERT) tablet 25 mg (25 mg Oral Given 11/01/21 1905)  ondansetron (ZOFRAN-ODT) disintegrating tablet 4 mg (4 mg Oral Given 11/01/21 1905)    ED Course/ Medical Decision Making/ A&P                           Medical Decision Making Amount and/or Complexity of Data Reviewed Labs: ordered. Radiology: ordered.  Risk Prescription drug management.   Patient here for evaluation of vertigo, headache.  She does have significantly unsteady gait on evaluation, persistent vertigo despite treatment with medications.  CT is negative for acute abnormality.  Plan to obtain MRI to rule out CVA.  Patient care transferred pending MRI.        Final Clinical Impression(s) / ED Diagnoses Final diagnoses:  None    Rx / DC Orders ED Discharge Orders     None         Quintella Reichert, MD 11/02/21 [phone]

## 2022-05-16 ENCOUNTER — Telehealth: Payer: Self-pay

## 2022-05-16 NOTE — Telephone Encounter (Signed)
Call to pt reference receiving referral from MD via phone call. Pt wanted to be in same class as friend however class is full. Offered 05/27/22 T/TH 1p-215p. Pt is going to check her schedule and call me back.

## 2022-05-30 ENCOUNTER — Telehealth: Payer: Self-pay

## 2022-05-30 NOTE — Telephone Encounter (Signed)
LVMT pt to see if she wanted to participate in PREP starting on 06/03/22.

## 2022-07-11 ENCOUNTER — Telehealth: Payer: Self-pay

## 2022-07-11 NOTE — Telephone Encounter (Signed)
Call to pt reference next PREP class starting on 07/22/22 (T/TH 230p-345p). Would prefer a morning class. Next 1030a class will be a M/W starting 08/25/22. Pt sts that would work out better. Will call her closer to start of class to do intake

## 2022-08-15 ENCOUNTER — Telehealth: Payer: Self-pay

## 2022-08-15 NOTE — Telephone Encounter (Signed)
VMT pt requesting call back to discuss starting PREP on 08/25/22

## 2023-12-22 ENCOUNTER — Ambulatory Visit (INDEPENDENT_AMBULATORY_CARE_PROVIDER_SITE_OTHER)

## 2023-12-22 VITALS — BP 114/76 | HR 87 | Temp 97.9°F | Ht 66.0 in | Wt 221.2 lb

## 2023-12-22 DIAGNOSIS — G4709 Other insomnia: Secondary | ICD-10-CM

## 2023-12-22 DIAGNOSIS — Z6835 Body mass index (BMI) 35.0-35.9, adult: Secondary | ICD-10-CM

## 2023-12-22 DIAGNOSIS — G4721 Circadian rhythm sleep disorder, delayed sleep phase type: Secondary | ICD-10-CM

## 2023-12-22 DIAGNOSIS — E66812 Obesity, class 2: Secondary | ICD-10-CM

## 2023-12-22 DIAGNOSIS — G4733 Obstructive sleep apnea (adult) (pediatric): Secondary | ICD-10-CM

## 2023-12-22 MED ORDER — RAMELTEON 8 MG PO TABS
8.0000 mg | ORAL_TABLET | Freq: Every day | ORAL | 0 refills | Status: AC
Start: 2023-12-22 — End: ?

## 2023-12-22 NOTE — Progress Notes (Signed)
 Pulmonology Office Visit   Subjective:  Patient ID: Patricia Ramos, female    DOB: 02-22-1945  MRN: [id number]  Referred by: Baird Comer GAILS, NP  CC:  Chief Complaint  Patient presents with   Consult    Pt has never had a sleep study. States she can't sleep and is told she snores.     HPI Patricia Ramos is a 78 y.o. female with hypertension, hyperlipidemia, class II obesity presents for evaluation of OSA.  Respective notes from provider reviewed as appropriate to gather relevant information for patient care.   Discussed the use of AI scribe software for clinical note transcription with the patient, who gave verbal consent to proceed.  History of Present Illness   Patricia Ramos is a 78 year old female who presents with sleep apnea. She was referred by her primary care physician for evaluation of sleep apnea.  She has a long-standing history of sleep disturbances, characterized by difficulty falling asleep and maintaining sleep. She averages about one to two hours of sleep per night, often going to bed around 1 to 3 AM and waking up around 8 to 9 AM without an alarm. She does not take naps during the day and describes her sleep as 'tossing and turning', sometimes getting up to walk around. Various over-the-counter and prescription sleep aids, including Benadryl , Valium , and Ambien , have been ineffective. Benadryl  initially helped but now has the opposite effect.  She reports waking up gasping or choking for air, morning headaches, and dry mouth. Her daughter has mentioned that she snores, though she is unsure if she stops breathing during sleep. She sleeps on her side and has a history of rotating 12-hour shifts for 32 years, which she believes has contributed to her sleep issues.  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 She is interested in weight loss medications and notes that  she does not eat much, but her diet may not be optimal. No anxiety, depression, coffee consumption, or smoking.  Her son, aged 40, has sleep apnea and uses a CPAP machine. She previously worked as a midwife and drove trucks.      Sleep History: Has tried benadryl , ambien  and valium  for sleep before. Benadryl  used to work before. Currently not on sleep aid.   PRIOR TESTS and IMAGING: No sleep study on file.     12/22/2023   11:00 AM  Results of the Epworth flowsheet  Sitting and reading 1  Watching TV 1  Sitting, inactive in a public place (e.g. a theatre or a meeting) 0  As a passenger in a car for an hour without a break 0  Lying down to rest in the afternoon when circumstances permit 0  Sitting and talking to someone 0  Sitting quietly after a lunch without alcohol 0  In a car, while stopped for a few minutes in traffic 0  Total score 2    Allergies: Codeine and Penicillins  Current Outpatient Medications:    acetaminophen  (TYLENOL ) 325 MG tablet, Take 650 mg by mouth every 6 (six) hours as needed., Disp: , Rfl:    albuterol  (VENTOLIN  HFA) 108 (90 Base) MCG/ACT inhaler, Inhale 2 puffs into the lungs every 6 (six) hours as needed., Disp: , Rfl:    celecoxib (CELEBREX) 200 MG capsule, Take 200 mg by mouth as needed., Disp: , Rfl:    dexlansoprazole  (DEXILANT ) 60 MG  capsule, Take 1 capsule (60 mg total) by mouth daily., Disp: 30 capsule, Rfl: 3   diazepam  (VALIUM ) 5 MG tablet, Take 5 mg by mouth every 8 (eight) hours as needed., Disp: , Rfl:    hydrochlorothiazide (HYDRODIURIL) 12.5 MG tablet, Take 12.5 mg by mouth daily., Disp: , Rfl:    HYDROcodone -acetaminophen  (NORCO/VICODIN) 5-325 MG tablet, Take 1 tablet by mouth every 6 (six) hours as needed., Disp: , Rfl:    ibuprofen  (ADVIL ) 800 MG tablet, Take 800 mg by mouth every 6 (six) hours as needed., Disp: , Rfl:    mometasone  (ELOCON ) 0.1 % cream, APPLY TO AFFECTED AREA AS NEEDED, Disp: 45 g, Rfl: 3   OMEPRAZOLE PO, Take  1 tablet by mouth daily., Disp: , Rfl:    ramelteon  (ROZEREM ) 8 MG tablet, Take 1 tablet (8 mg total) by mouth at bedtime., Disp: 30 tablet, Rfl: 0   atorvastatin  (LIPITOR) 20 MG tablet, Take 1 tablet (20 mg total) by mouth daily. (Patient not taking: Reported on 12/22/2023), Disp: 90 tablet, Rfl: 3   metoCLOPramide  (REGLAN ) 10 MG tablet, Take 1 tablet (10 mg total) by mouth every 6 (six) hours. (Patient not taking: Reported on 12/22/2023), Disp: 30 tablet, Rfl: 0 Past Medical History:  Diagnosis Date   Anxiety    GERD    Personal history of colonic polyps, hyperplastic    Past Surgical History:  Procedure Laterality Date   CARPAL TUNNEL RELEASE Left    REPLACEMENT TOTAL KNEE Right 2008   TUBAL LIGATION     Family History  Problem Relation Age of Onset   Hypertension Mother    Breast cancer Mother 72   Hypertension Father    Diabetes Father    Hypertension Brother    Hypertension Brother    Hypertension Brother    Colon cancer Neg Hx    Esophageal cancer Neg Hx    Rectal cancer Neg Hx    Stomach cancer Neg Hx    Social History   Socioeconomic History   Marital status: Divorced    Spouse name: Not on file   Number of children: Not on file   Years of education: Not on file   Highest education level: Not on file  Occupational History   Not on file  Tobacco Use   Smoking status: Never   Smokeless tobacco: Never  Substance and Sexual Activity   Alcohol use: No    Alcohol/week: 0.0 standard drinks of alcohol   Drug use: No   Sexual activity: Not on file  Other Topics Concern   Not on file  Social History Narrative   Divorced-lives with 78 yo g-dtr. retired midwife   Does estate airline pilot    Social Drivers of Corporate Investment Banker Strain: Low Risk  (03/06/2023)   Received from [facility]   Overall Financial Resource Strain (CARDIA)    Difficulty of Paying Living Expenses: Not hard at all  Food Insecurity: No Food Insecurity (03/06/2023)   Received from  [facility]   Hunger Vital Sign    Within the past 12 months, you worried that your food would run out before you got the money to buy more.: Never true    Within the past 12 months, the food you bought just didn't last and you didn't have money to get more.: Never true  Transportation Needs: No Transportation Needs (03/06/2023)   Received from [facility] - Transportation    Lack of Transportation (Medical): No  Lack of Transportation (Non-Medical): No  Physical Activity: Unknown (10/23/2022)   Received from [facility]   Exercise Vital Sign    On average, how many days per week do you engage in moderate to strenuous exercise (like a brisk walk)?: 0 days    Minutes of Exercise per Session: Not on file  Stress: Stress Concern Present (10/23/2022)   Received from [facility] of Occupational [facility] - Occupational Stress Questionnaire    Feeling of Stress : To some extent  Social Connections: Socially Integrated (10/23/2022)   Received from [facility]   Social Network    How would you rate your social network (family, work, friends)?: Good participation with social networks  Intimate Partner Violence: Unknown (10/23/2022)   Received from [facility]   HITS    Over the last 12 months how often did your partner physically hurt you?: Never    Over the last 12 months how often did your partner insult you or talk down to you?: Never    Threaten Physical Harm: Not on file    Over the last 12 months how often did your partner scream or curse at you?: Never       Objective:  BP 114/76   Pulse 87   Temp 97.9 F (36.6 C)   Ht 5' 6 (1.676 m) Comment: Per pt  Wt 221 lb 3.2 oz (100.3 kg)   SpO2 93% Comment: RA  BMI 35.70 kg/m  BMI Readings from Last 3 Encounters:  12/22/23 35.70 kg/m  11/01/21 34.02 kg/m  01/19/19 33.67 kg/m    Physical Exam: Physical Exam   ENT: Normal mucosa. No hypertrophy of inferior turbinates. Tonsils are normal  sized. Modified Mallampati score is normal. Oral cavity normal. PULMONARY: Lungs clear to auscultation bilaterally, no adventitious breath sounds. CARDIOVASCULAR: Regular rate and rhythm, S1 S2 normal, no murmurs. ABDOMEN: Abdomen soft, nontender. Bowel sounds are normal. EXTREMITIES: No peripheral edema noted.       Diagnostic Review:  Last metabolic panel Lab Results  Component Value Date   GLUCOSE 99 11/01/2021   NA 141 11/01/2021   K 4.0 11/01/2021   CL 105 11/01/2021   CO2 27 11/01/2021   BUN 13 11/01/2021   CREATININE 0.63 11/01/2021   GFRNONAA >60 11/01/2021   CALCIUM  9.8 11/01/2021   PROT 6.5 08/17/2018   ALBUMIN 4.4 08/17/2018   LABGLOB 2.2 08/19/2017   AGRATIO 2.0 08/19/2017   BILITOT 0.3 08/17/2018   ALKPHOS 60 08/17/2018   AST 19 08/17/2018   ALT 16 08/17/2018   ANIONGAP 9 11/01/2021         Assessment & Plan:   Assessment & Plan OSA (obstructive sleep apnea)  Orders:   Home sleep test; Future  Other insomnia     Delayed sleep phase syndrome     Obesity, class 2        Assessment and Plan    Suspected obstructive sleep apnea Symptoms suggest possible airway obstruction during sleep. Explained potential for paradoxical insomnia. - Ordered home sleep study to evaluate for obstructive sleep apnea. - If confirmed, willorder CPAP.   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 Previous medications ineffective. Explained Rozerem  for sleep onset. - Prescribed Rozerem  for sleep onset. - Advised to keep a sleep diary. - Recommended avoiding naps and reducing screen time before bed. - CBTI techniques discussed. Will refer to CBTI if needed after sleep study.  -  if sleep study is negative for sleep apnea will consider orexin antagonist.      Obesity class II: Interested to go on GLP1 inhibitor if needed.  Discussed lifestyle modifications.     Notes from 10/19/23 by PCP reviewed as to gather relevant information for patient care and formulating plan.  Advised against sleepy driving.   No follow-ups on file.   I personally spent a total of 30 minutes in the care of the patient today including preparing to see the patient, getting/reviewing separately obtained history, performing a medically appropriate exam/evaluation, counseling and educating, placing orders, documenting clinical information in the EHR, independently interpreting results, and communicating results.   Tahlia Deamer, MD

## 2023-12-22 NOTE — Assessment & Plan Note (Signed)
 Patricia Ramos

## 2023-12-22 NOTE — Patient Instructions (Signed)
  VISIT SUMMARY:  Today, we discussed your ongoing sleep issues, including difficulty falling asleep, waking up frequently, and feeling unrested. We reviewed your symptoms and family history, and we have a plan to evaluate and address your sleep apnea and insomnia.  YOUR PLAN:  -SUSPECTED OBSTRUCTIVE SLEEP APNEA: Obstructive sleep apnea is a condition where your airway becomes blocked during sleep, causing you to stop breathing temporarily. We have ordered a home sleep study to confirm this diagnosis. If the study shows that you have sleep apnea, we will discuss using a CPAP machine to help keep your airway open while you sleep.  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 We have prescribed Rozerem  to help you fall asleep. Please take it around 1 am. Keep a sleep diary to track your sleep patterns, avoid naps, and reduce screen time before bed. If the sleep study does not show sleep apnea, we may consider stronger medication.                       Contains text generated by Abridge.                                 Contains text generated by Abridge.

## 2023-12-26 ENCOUNTER — Other Ambulatory Visit: Payer: Self-pay

## 2023-12-26 ENCOUNTER — Encounter (HOSPITAL_BASED_OUTPATIENT_CLINIC_OR_DEPARTMENT_OTHER): Payer: Self-pay

## 2023-12-26 ENCOUNTER — Emergency Department (HOSPITAL_BASED_OUTPATIENT_CLINIC_OR_DEPARTMENT_OTHER): Admitting: Radiology

## 2023-12-26 ENCOUNTER — Observation Stay (HOSPITAL_BASED_OUTPATIENT_CLINIC_OR_DEPARTMENT_OTHER): Admission: EM | Admit: 2023-12-26 | Discharge: 2023-12-28 | Disposition: A

## 2023-12-26 DIAGNOSIS — J45901 Unspecified asthma with (acute) exacerbation: Secondary | ICD-10-CM | POA: Diagnosis not present

## 2023-12-26 DIAGNOSIS — K219 Gastro-esophageal reflux disease without esophagitis: Secondary | ICD-10-CM | POA: Insufficient documentation

## 2023-12-26 DIAGNOSIS — Z79899 Other long term (current) drug therapy: Secondary | ICD-10-CM | POA: Diagnosis not present

## 2023-12-26 DIAGNOSIS — R059 Cough, unspecified: Secondary | ICD-10-CM | POA: Insufficient documentation

## 2023-12-26 DIAGNOSIS — F419 Anxiety disorder, unspecified: Secondary | ICD-10-CM | POA: Insufficient documentation

## 2023-12-26 DIAGNOSIS — E66813 Obesity, class 3: Secondary | ICD-10-CM | POA: Insufficient documentation

## 2023-12-26 DIAGNOSIS — E876 Hypokalemia: Secondary | ICD-10-CM | POA: Diagnosis not present

## 2023-12-26 DIAGNOSIS — I10 Essential (primary) hypertension: Secondary | ICD-10-CM | POA: Insufficient documentation

## 2023-12-26 DIAGNOSIS — R0603 Acute respiratory distress: Secondary | ICD-10-CM | POA: Diagnosis not present

## 2023-12-26 DIAGNOSIS — Z1152 Encounter for screening for COVID-19: Secondary | ICD-10-CM | POA: Insufficient documentation

## 2023-12-26 DIAGNOSIS — R0602 Shortness of breath: Secondary | ICD-10-CM | POA: Diagnosis present

## 2023-12-26 DIAGNOSIS — E785 Hyperlipidemia, unspecified: Secondary | ICD-10-CM | POA: Insufficient documentation

## 2023-12-26 DIAGNOSIS — G4721 Circadian rhythm sleep disorder, delayed sleep phase type: Secondary | ICD-10-CM | POA: Insufficient documentation

## 2023-12-26 DIAGNOSIS — E66812 Obesity, class 2: Secondary | ICD-10-CM | POA: Diagnosis not present

## 2023-12-26 DIAGNOSIS — J45909 Unspecified asthma, uncomplicated: Secondary | ICD-10-CM | POA: Diagnosis present

## 2023-12-26 DIAGNOSIS — R061 Stridor: Secondary | ICD-10-CM | POA: Diagnosis not present

## 2023-12-26 DIAGNOSIS — R0600 Dyspnea, unspecified: Secondary | ICD-10-CM | POA: Diagnosis present

## 2023-12-26 LAB — CBC WITH DIFFERENTIAL/PLATELET
Abs Immature Granulocytes: 0.05 K/uL (ref 0.00–0.07)
Basophils Absolute: 0.1 K/uL (ref 0.0–0.1)
Basophils Relative: 1 %
Eosinophils Absolute: 0.8 K/uL — ABNORMAL HIGH (ref 0.0–0.5)
Eosinophils Relative: 9 %
HCT: 41.5 % (ref 36.0–46.0)
Hemoglobin: 13.2 g/dL (ref 12.0–15.0)
Immature Granulocytes: 1 %
Lymphocytes Relative: 34 %
Lymphs Abs: 3 K/uL (ref 0.7–4.0)
MCH: 28 pg (ref 26.0–34.0)
MCHC: 31.8 g/dL (ref 30.0–36.0)
MCV: 88.1 fL (ref 80.0–100.0)
Monocytes Absolute: 0.8 K/uL (ref 0.1–1.0)
Monocytes Relative: 9 %
Neutro Abs: 4.1 K/uL (ref 1.7–7.7)
Neutrophils Relative %: 46 %
Platelets: 331 K/uL (ref 150–400)
RBC: 4.71 MIL/uL (ref 3.87–5.11)
RDW: 12.8 % (ref 11.5–15.5)
WBC: 8.7 K/uL (ref 4.0–10.5)
nRBC: 0 % (ref 0.0–0.2)

## 2023-12-26 LAB — RESPIRATORY PANEL BY PCR

## 2023-12-26 LAB — MAGNESIUM: Magnesium: 2.1 mg/dL (ref 1.7–2.4)

## 2023-12-26 LAB — TROPONIN T, HIGH SENSITIVITY: Troponin T High Sensitivity: <15 ng/L (ref 0–19)

## 2023-12-26 LAB — HEPATIC FUNCTION PANEL
ALT: 22 U/L (ref 0–44)
AST: 23 U/L (ref 15–41)
Albumin: 4.1 g/dL (ref 3.5–5.0)
Alkaline Phosphatase: 88 U/L (ref 38–126)
Bilirubin, Direct: <0.1 mg/dL (ref 0.0–0.2)
Total Bilirubin: <0.2 mg/dL (ref 0.0–1.2)
Total Protein: 7 g/dL (ref 6.5–8.1)

## 2023-12-26 LAB — BASIC METABOLIC PANEL WITH GFR
Anion gap: 9 (ref 5–15)
Anion gap: 13 (ref 5–15)
BUN: 18 mg/dL (ref 8–23)
BUN: 14 mg/dL (ref 8–23)
CO2: 29 mmol/L (ref 22–32)
CO2: 25 mmol/L (ref 22–32)
Calcium: 10.4 mg/dL — ABNORMAL HIGH (ref 8.9–10.3)
Calcium: 9.3 mg/dL (ref 8.9–10.3)
Chloride: 108 mmol/L (ref 98–111)
Chloride: 108 mmol/L (ref 98–111)
Creatinine, Ser: 0.6 mg/dL (ref 0.44–1.00)
Creatinine, Ser: 0.77 mg/dL (ref 0.44–1.00)
GFR, Estimated: >60 mL/min (ref >60)
GFR, Estimated: >60 mL/min (ref >60)
Glucose, Bld: 113 mg/dL — ABNORMAL HIGH (ref 70–99)
Glucose, Bld: 154 mg/dL — ABNORMAL HIGH (ref 70–99)
Potassium: 3.4 mmol/L — ABNORMAL LOW (ref 3.5–5.1)
Potassium: 3.2 mmol/L — ABNORMAL LOW (ref 3.5–5.1)
Sodium: 146 mmol/L — ABNORMAL HIGH (ref 135–145)
Sodium: 146 mmol/L — ABNORMAL HIGH (ref 135–145)

## 2023-12-26 LAB — RESP PANEL BY RT-PCR (RSV, FLU A&B, COVID)  RVPGX2
Influenza A by PCR: NEGATIVE
Influenza B by PCR: NEGATIVE
Resp Syncytial Virus by PCR: NEGATIVE
SARS Coronavirus 2 by RT PCR: NEGATIVE

## 2023-12-26 LAB — C-REACTIVE PROTEIN: CRP: 1 mg/dL — ABNORMAL HIGH (ref <1.0)

## 2023-12-26 LAB — D-DIMER, QUANTITATIVE: D-Dimer, Quant: 0.53 ug{FEU}/mL — ABNORMAL HIGH (ref 0.00–0.50)

## 2023-12-26 LAB — PRO BRAIN NATRIURETIC PEPTIDE: Pro Brain Natriuretic Peptide: 60 pg/mL (ref <300.0)

## 2023-12-26 LAB — PROCALCITONIN: Procalcitonin: <0.1 ng/mL

## 2023-12-26 MED ORDER — IPRATROPIUM-ALBUTEROL 0.5-2.5 (3) MG/3ML IN SOLN
3.0000 mL | RESPIRATORY_TRACT | Status: DC | PRN
  Administered 2023-12-26 (×2): 3 mL via RESPIRATORY_TRACT
  Filled 2023-12-26 (×2): qty 3

## 2023-12-26 MED ORDER — IPRATROPIUM-ALBUTEROL 0.5-2.5 (3) MG/3ML IN SOLN
3.0000 mL | Freq: Four times a day (QID) | RESPIRATORY_TRACT | Status: DC
  Administered 2023-12-26 – 2023-12-28 (×8): 3 mL via RESPIRATORY_TRACT
  Filled 2023-12-26 (×8): qty 3

## 2023-12-26 MED ORDER — IPRATROPIUM-ALBUTEROL 0.5-2.5 (3) MG/3ML IN SOLN
3.0000 mL | RESPIRATORY_TRACT | Status: AC
  Administered 2023-12-26: 3 mL via RESPIRATORY_TRACT

## 2023-12-26 MED ORDER — BENZONATATE 100 MG PO CAPS
100.0000 mg | ORAL_CAPSULE | Freq: Three times a day (TID) | ORAL | Status: DC | PRN
Start: 2023-12-26 — End: 2023-12-26

## 2023-12-26 MED ORDER — METHYLPREDNISOLONE SODIUM SUCC 125 MG IJ SOLR
125.0000 mg | Freq: Once | INTRAMUSCULAR | Status: AC
  Administered 2023-12-26: 125 mg via INTRAVENOUS
  Filled 2023-12-26: qty 2

## 2023-12-26 MED ORDER — GUAIFENESIN 100 MG/5ML PO LIQD
15.0000 mL | Freq: Four times a day (QID) | ORAL | Status: DC | PRN
  Administered 2023-12-26 – 2023-12-28 (×4): 15 mL via ORAL
  Filled 2023-12-26 (×4): qty 15

## 2023-12-26 MED ORDER — MONTELUKAST SODIUM 10 MG PO TABS
10.0000 mg | ORAL_TABLET | Freq: Every day | ORAL | Status: DC
  Administered 2023-12-26 – 2023-12-27 (×2): 10 mg via ORAL
  Filled 2023-12-26 (×2): qty 1

## 2023-12-26 MED ORDER — RAMELTEON 8 MG PO TABS
8.0000 mg | ORAL_TABLET | Freq: Every day | ORAL | Status: DC
  Administered 2023-12-26 – 2023-12-27 (×2): 8 mg via ORAL
  Filled 2023-12-26 (×3): qty 1

## 2023-12-26 MED ORDER — HYDROCODONE-ACETAMINOPHEN 5-325 MG PO TABS: 1.0000 | ORAL_TABLET | Freq: Four times a day (QID) | ORAL | Status: DC | PRN

## 2023-12-26 MED ORDER — ACETAMINOPHEN 325 MG PO TABS
650.0000 mg | ORAL_TABLET | Freq: Four times a day (QID) | ORAL | Status: DC | PRN
  Administered 2023-12-26 – 2023-12-27 (×3): 650 mg via ORAL
  Filled 2023-12-26 (×4): qty 2

## 2023-12-26 MED ORDER — ATORVASTATIN CALCIUM 10 MG PO TABS
20.0000 mg | ORAL_TABLET | Freq: Every day | ORAL | Status: DC
  Administered 2023-12-26 – 2023-12-28 (×3): 20 mg via ORAL
  Filled 2023-12-26 (×3): qty 2

## 2023-12-26 MED ORDER — MAGNESIUM SULFATE 2 GM/50ML IV SOLN
2.0000 g | Freq: Once | INTRAVENOUS | Status: AC
  Administered 2023-12-26: 2 g via INTRAVENOUS
  Filled 2023-12-26: qty 50

## 2023-12-26 MED ORDER — GUAIFENESIN ER 600 MG PO TB12
600.0000 mg | ORAL_TABLET | Freq: Two times a day (BID) | ORAL | Status: DC
  Filled 2023-12-26: qty 1

## 2023-12-26 MED ORDER — POTASSIUM CHLORIDE CRYS ER 20 MEQ PO TBCR
40.0000 meq | EXTENDED_RELEASE_TABLET | Freq: Once | ORAL | Status: AC
  Administered 2023-12-26: 40 meq via ORAL
  Filled 2023-12-26: qty 2

## 2023-12-26 MED ORDER — PREDNISONE 20 MG PO TABS
40.0000 mg | ORAL_TABLET | Freq: Every day | ORAL | Status: DC
  Administered 2023-12-26 – 2023-12-28 (×3): 40 mg via ORAL
  Filled 2023-12-26 (×3): qty 2

## 2023-12-26 MED ORDER — HYDROCHLOROTHIAZIDE 12.5 MG PO TABS
12.5000 mg | ORAL_TABLET | Freq: Every day | ORAL | Status: DC
  Administered 2023-12-26 – 2023-12-28 (×3): 12.5 mg via ORAL
  Filled 2023-12-26 (×3): qty 1

## 2023-12-26 MED ORDER — IPRATROPIUM-ALBUTEROL 0.5-2.5 (3) MG/3ML IN SOLN
3.0000 mL | RESPIRATORY_TRACT | Status: AC
  Administered 2023-12-26: 3 mL via RESPIRATORY_TRACT
  Filled 2023-12-26: qty 3

## 2023-12-26 MED ORDER — IPRATROPIUM-ALBUTEROL 0.5-2.5 (3) MG/3ML IN SOLN
RESPIRATORY_TRACT | Status: AC
  Filled 2023-12-26: qty 3

## 2023-12-26 MED ORDER — RACEPINEPHRINE HCL 2.25 % IN NEBU
INHALATION_SOLUTION | RESPIRATORY_TRACT | Status: AC
  Filled 2023-12-26: qty 15

## 2023-12-26 MED ORDER — PANTOPRAZOLE SODIUM 40 MG PO TBEC
40.0000 mg | DELAYED_RELEASE_TABLET | Freq: Every day | ORAL | Status: DC
  Administered 2023-12-26 – 2023-12-28 (×3): 40 mg via ORAL
  Filled 2023-12-26 (×3): qty 1

## 2023-12-26 MED ORDER — BENZONATATE 100 MG PO CAPS
100.0000 mg | ORAL_CAPSULE | Freq: Three times a day (TID) | ORAL | Status: DC
  Administered 2023-12-26 – 2023-12-28 (×7): 100 mg via ORAL
  Filled 2023-12-26 (×7): qty 1

## 2023-12-26 MED ORDER — ENOXAPARIN SODIUM 60 MG/0.6ML IJ SOSY
0.5000 mg/kg | PREFILLED_SYRINGE | INTRAMUSCULAR | Status: DC
  Administered 2023-12-26 – 2023-12-28 (×3): 50 mg via SUBCUTANEOUS
  Filled 2023-12-26 (×2): qty 0.6

## 2023-12-26 MED ORDER — ALBUTEROL SULFATE (2.5 MG/3ML) 0.083% IN NEBU
INHALATION_SOLUTION | RESPIRATORY_TRACT | Status: AC
  Filled 2023-12-26: qty 3

## 2023-12-26 MED ORDER — ENOXAPARIN SODIUM 40 MG/0.4ML IJ SOSY
40.0000 mg | PREFILLED_SYRINGE | INTRAMUSCULAR | Status: DC
  Filled 2023-12-26: qty 0.4

## 2023-12-26 MED ORDER — ALBUTEROL SULFATE (2.5 MG/3ML) 0.083% IN NEBU
5.0000 mg | INHALATION_SOLUTION | RESPIRATORY_TRACT | Status: AC
  Administered 2023-12-26: 5 mg via RESPIRATORY_TRACT

## 2023-12-26 MED ORDER — FLUTICASONE PROPIONATE 50 MCG/ACT NA SUSP
1.0000 | Freq: Every day | NASAL | Status: DC
  Filled 2023-12-26: qty 16

## 2023-12-26 MED ORDER — BUDESONIDE 0.25 MG/2ML IN SUSP
0.2500 mg | Freq: Two times a day (BID) | RESPIRATORY_TRACT | Status: DC
  Administered 2023-12-26 – 2023-12-28 (×4): 0.25 mg via RESPIRATORY_TRACT
  Filled 2023-12-26 (×4): qty 2

## 2023-12-26 MED ORDER — RACEPINEPHRINE HCL 2.25 % IN NEBU
0.5000 mL | INHALATION_SOLUTION | Freq: Once | RESPIRATORY_TRACT | Status: AC
  Administered 2023-12-26: 5 mL via RESPIRATORY_TRACT

## 2023-12-26 NOTE — ED Triage Notes (Signed)
 Pt POV from home c/o SOB x 1 month. States got worse last night. Advises home meds not helping. Able to speak in complete sentences. NAD during triage

## 2023-12-26 NOTE — ED Notes (Signed)
 I have just given report to [place], CHARITY FUNDRAISER at [facility]. [facility] is en route. Pt. Remains in no distress.

## 2023-12-26 NOTE — Progress Notes (Signed)
 Plan of Care Note for accepted transfer   Patient: Patricia Ramos MRN: [id number]   DOA: 12/26/2023  Facility requesting transfer: [facility]. Requesting Provider: Lamar Gander, MD. Reason for transfer: Reactive airway disease with wheezing. Facility course:  78 year old female with a past medical history of anxiety, hyperlipidemia, GERD, impaired fasting glucose, class III obesity class II, vitamin D  deficiency who presented to the emergency department with complaints of progressively worse dyspnea, nonproductive cough and wheezing.  She denies a history of COPD or asthma, but her PCPs records show that she has been previously treated for asthma.  There was a question of stridor while the patient was sleeping in the emergency department.  She received racemic epinephrine, but there is no stridor now.  Case discussed by Dr. Gander with ENT (Dr. Roark) who may be planning a scope this admission.  Plan of care: The patient is accepted for admission to Telemetry unit, at [facility].  Author: Alm Dorn Castor, MD 12/26/2023  Check [email or web address] for on-call coverage.  Nursing staff, Please call [facility] Admits & Consults System-Wide number on Amion as soon as patient's arrival, so appropriate admitting provider can evaluate the pt.

## 2023-12-26 NOTE — ED Notes (Signed)
 Racemic given for stridor

## 2023-12-26 NOTE — ED Provider Notes (Addendum)
  EMERGENCY DEPARTMENT AT [facility] Provider Note   CSN: [id number] Arrival date & time: 12/26/23  0410     Patient presents with: No chief complaint on file.   Patricia Ramos is a 78 y.o. female.   HPI   78 year old female with medical history significant for GERD, anxiety, obesity, OSA, asthma presenting to the emergency department with cough and shortness of breath for the past month.  The patient states symptoms worsened over the past 24 hours.  She saw pulmonology and clinic on 11/25 to discuss her OSA.  She denies any history of asthma or COPD however on chart review the patient has seen her PCP previously for asthma, most recently 03/06/2023.  She has become increasingly dyspneic, denies any fevers or chills.  She denies any chest pain.  She denies any rash, nausea, throat swelling sensation.  Prior to Admission medications   Medication Sig Start Date End Date Taking? Authorizing Provider  acetaminophen  (TYLENOL ) 325 MG tablet Take 650 mg by mouth every 6 (six) hours as needed.    [provider]  albuterol  (VENTOLIN  HFA) 108 (90 Base) MCG/ACT inhaler Inhale 2 puffs into the lungs every 6 (six) hours as needed. 12/16/22   [provider]  atorvastatin  (LIPITOR) 20 MG tablet Take 1 tablet (20 mg total) by mouth daily. Patient not taking: Reported on 12/22/2023 09/16/18   Mona Vinie BROCKS, MD  celecoxib (CELEBREX) 200 MG capsule Take 200 mg by mouth as needed. 10/19/23   [provider]  dexlansoprazole  (DEXILANT ) 60 MG capsule Take 1 capsule (60 mg total) by mouth daily. 08/17/18   Prentiss Frieze, DO  diazepam  (VALIUM ) 5 MG tablet Take 5 mg by mouth every 8 (eight) hours as needed. 10/23/22   [provider]  hydrochlorothiazide (HYDRODIURIL) 12.5 MG tablet Take 12.5 mg by mouth daily. 10/25/21   [provider]  HYDROcodone -acetaminophen  (NORCO/VICODIN) 5-325 MG tablet Take 1 tablet by mouth every 6 (six) hours as needed.  08/17/23   [provider]  ibuprofen  (ADVIL ) 800 MG tablet Take 800 mg by mouth every 6 (six) hours as needed. 02/24/12   [provider]  metoCLOPramide  (REGLAN ) 10 MG tablet Take 1 tablet (10 mg total) by mouth every 6 (six) hours. Patient not taking: Reported on 12/22/2023 11/02/21   Patti Frieze SAUNDERS, PA-C  mometasone  (ELOCON ) 0.1 % cream APPLY TO AFFECTED AREA AS NEEDED 12/18/17   Scarlet Elsie LABOR, MD  OMEPRAZOLE PO Take 1 tablet by mouth daily.    [provider]  ramelteon  (ROZEREM ) 8 MG tablet Take 1 tablet (8 mg total) by mouth at bedtime. 12/22/23   Pawar, Rahul, MD    Allergies: Codeine and Penicillins    Review of Systems  All other systems reviewed and are negative.   Updated Vital Signs BP 124/71   Pulse (!) 110   Temp (!) 97.4 F (36.3 C)   Resp (!) 21   SpO2 100%   Physical Exam Vitals and nursing note reviewed.  Constitutional:      General: She is not in acute distress.    Appearance: She is well-developed.  HENT:     Head: Normocephalic and atraumatic.  Eyes:     Conjunctiva/sclera: Conjunctivae normal.  Cardiovascular:     Rate and Rhythm: Regular rhythm. Tachycardia present.     Heart sounds: No murmur heard. Pulmonary:     Breath sounds: Wheezing present.     Comments: Expiratory wheezing all lung fields, tachypnea present Abdominal:  Palpations: Abdomen is soft.     Tenderness: There is no abdominal tenderness.  Musculoskeletal:        General: No swelling.     Cervical back: Neck supple.  Skin:    General: Skin is warm and dry.     Capillary Refill: Capillary refill takes less than 2 seconds.  Neurological:     Mental Status: She is alert.  Psychiatric:        Mood and Affect: Mood normal.     (all labs ordered are listed, but only abnormal results are displayed) Labs Reviewed  BASIC METABOLIC PANEL WITH GFR - Abnormal; Notable for the following components:      Result Value   Sodium 146 (*)    Potassium  3.4 (*)    Glucose, Bld 113 (*)    Calcium  10.4 (*)    All other components within normal limits  CBC WITH DIFFERENTIAL/PLATELET - Abnormal; Notable for the following components:   Eosinophils Absolute 0.8 (*)    All other components within normal limits  D-DIMER, QUANTITATIVE - Abnormal; Notable for the following components:   D-Dimer, Quant 0.53 (*)    All other components within normal limits  RESP PANEL BY RT-PCR (RSV, FLU A&B, COVID)  RVPGX2  PRO BRAIN NATRIURETIC PEPTIDE  HEPATIC FUNCTION PANEL  TROPONIN T, HIGH SENSITIVITY    EKG: EKG Interpretation Date/Time:  Saturday December 26 2023 04:23:50 EST Ventricular Rate:  115 PR Interval:  183 QRS Duration:  93 QT Interval:  335 QTC Calculation: 464 R Axis:   -11  Text Interpretation: Sinus tachycardia Minimal ST depression, diffuse leads Baseline wander in lead(s) II Confirmed by Jerrol Agent ([id number]) on 12/26/2023 4:30:46 AM  Radiology: [facility] Chest Port 1 View Result Date: 12/26/2023 EXAM: 1 VIEW(S) XRAY OF THE CHEST 12/26/2023 04:37:00 AM COMPARISON: PA and lateral radiographs of the chest dated 12/28/2017. CLINICAL HISTORY: SOB (shortness of breath) FINDINGS: LUNGS AND PLEURA: No focal pulmonary opacity. No pleural effusion. No pneumothorax. HEART AND MEDIASTINUM: No acute abnormality of the cardiac and mediastinal silhouettes. BONES AND SOFT TISSUES: No acute osseous abnormality. IMPRESSION: 1. No acute process. Electronically signed by: Evalene Coho MD 12/26/2023 04:39 AM EST RP Workstation: [id number]     .Critical Care  Performed by: Jerrol Agent, MD Authorized by: Jerrol Agent, MD   Critical care provider statement:    Critical care time (minutes):  30   Critical care was necessary to treat or prevent imminent or life-threatening deterioration of the following conditions:  Respiratory failure   Critical care was time spent personally by me on the following activities:  Development of treatment plan with  patient or surrogate, discussions with consultants, evaluation of patient's response to treatment, examination of patient, ordering and review of laboratory studies, ordering and review of radiographic studies, ordering and performing treatments and interventions, pulse oximetry, re-evaluation of patient's condition and review of old charts   Care discussed with: admitting provider      Medications Ordered in the ED  magnesium sulfate IVPB 2 g 50 mL (2 g Intravenous New Bag/Given 12/26/23 0550)  ipratropium-albuterol  (DUONEB) 0.5-2.5 (3) MG/3ML nebulizer solution 3 mL (3 mLs Nebulization Not Given 12/26/23 0430)  albuterol  (PROVENTIL ) (2.5 MG/3ML) 0.083% nebulizer solution 5 mg (5 mg Nebulization Not Given 12/26/23 0429)  methylPREDNISolone sodium succinate (SOLU-MEDROL) 125 mg/2 mL injection 125 mg (125 mg Intravenous Given 12/26/23 0438)  ipratropium-albuterol  (DUONEB) 0.5-2.5 (3) MG/3ML nebulizer solution 3 mL (3 mLs Nebulization Given 12/26/23 0534)  Racepinephrine HCl 2.25 %  nebulizer solution 0.5 mL ( Nebulization Given 12/26/23 0634)    Clinical Course as of 12/26/23 0700  Sat Dec 26, 2023  0450 D-Dimer, Quant(!): 0.53 D-dimer normal after age adjustment [JL]  0653 Assumed care from Dr Jerrol. 78 yo F hx of asthma pw 1 mo of SOB. Had diffuse wheezing on arrival but in NAD. Needs to be admitted for asthma exacerbation. On reassessment had some stridor but is not in acute distress. Neck x-ray is pending. Discussed with ENT (Dr Roark) who will perform a scope this admission. Giving racemic epi. Was discussed with hospitalist who wants a call back after the neck x-ray. [RP]    Clinical Course User Index [JL] Jerrol Agent, MD [RP] Yolande Lamar BROCKS, MD                                 Medical Decision Making Amount and/or Complexity of Data Reviewed Labs: ordered. Decision-making details documented in ED Course. Radiology: ordered.  Risk OTC drugs. Prescription drug  management. Decision regarding hospitalization.    78 year old female with medical history significant for GERD, anxiety, obesity, OSA, asthma presenting to the emergency department with cough and shortness of breath for the past month.  The patient states symptoms worsened over the past 24 hours.  She saw pulmonology and clinic on 11/25 to discuss her OSA.  She denies any history of asthma or COPD however on chart review the patient has seen her PCP previously for asthma, most recently 03/06/2023.  She has become increasingly dyspneic, denies any fevers or chills.  She denies any chest pain.  She denies any rash, nausea, throat swelling sensation.  On arrival, the patient was afebrile, tachycardic heart rate 119, tachypneic RR 23, BP 143/73, saturating 96% on room air.  On exam the patient had tachypnea and diffuse expiratory wheezing in all lung fields.  Considered viral syndrome, considered undiagnosed COPD, less likely PE, bacterial pneumonia.  EKG: Sinus tachycardia, nonspecific ST changes,  Chest x-ray: No acute cardiopulmonary disease  Labs: D-dimer normal after adjustment for age at 0.53, CBC without a leukocytosis or anemia, hepatic function panel normal, cardiac troponin and BNP normal, COVID flu and RSV PCR testing negative.  The patient was administered DuoNebs in addition to IV Solu-Medrol.  She was administered an additional DuoNeb as well as IV magnesium.  On repeat assessment, patient with improvement in wheezing, persistent dyspnea, persistent expiratory wheezing, patient states that she feels tight and on chart review does have a history of asthma exacerbation.  She has been doing nebulizer treatments at home.  She does have stridor at rest on repeat evaluation.  X-ray imaging of the neck soft tissue was ordered and the patient was administered racemic epinephrine.  ENT consulted in the setting of stridor, spoke with Dr. Roark who will see the patient in consultation.  Patient  without rash, no nausea or vomiting, no mucosal swelling, no evidence for anaphylaxis.  No evidence of angioedema.  Recommended admission for observation in the setting of the patient's presentation, hospitalist medicine consulted for admission, spoke with Dr. Abigail who recommended reengagement once x-ray imaging is complete. Signout given to Dr. Yolande at 0700 to admit the patient following XR results.     Final diagnoses:  Exacerbation of asthma, unspecified asthma severity, unspecified whether persistent  Stridor    ED Discharge Orders     None          Jerrol Agent,  MD 12/26/23 9363    Jerrol Agent, MD 12/26/23 0700

## 2023-12-26 NOTE — H&P (Addendum)
 History and Physical    Patient: Patricia Ramos FMW:[id number] DOB: June 28, 1945 DOA: 12/26/2023 DOS: the patient was seen and examined on 12/26/2023 PCP: Claudene Prentice [name]., FNP  Patient coming from: Home  Chief Complaint: No chief complaint on file.  HPI: Patricia Ramos is a 78 y.o. female with medical history significant of anxiety, HTN, HLD, GERD, impaired fasting glucose, obesity (class II), vitamin D  deficiency, R knee OA (on oxycodone  prn), peripheral neuropathy (on gabapentin  prn), and recent OP Pulmonology evaluation on 11/25 w/ suspected OSA (follows w/ Dr. Theodoro who ordered OP sleep study, as well as Rozerem  for sleep onset given c/f delayed sleep phase disorder) who presented to the emergency department with complaints of progressively worse dyspnea, nonproductive cough and wheezing.  The patient reported experiencing worsening shortness of breath over the past month. The symptoms escalated to the point where the patient could not manage them at home. The patient had been using albuterol  and a breathing treatment with a mask, as well as an inhaler, cough syrup, and steroids (from previous asthma flare). The exact dosage of the steroids was unknown, but the patient had been taking them for approximately four days. Despite these efforts, the patient continued to experience wheezing and fatigue, which caused her to present to the ED. The patient did not have a previous diagnosis of asthma until the current episode and had no known exposure to smoke or recent illness. Additionally, the patient reported pain radiating down the back of the legs when coughing.  In the ED, pt tachycardic, tachypneic w/o hypoxia, and noted to have audible stridor. Labs notable for D-dimer 0.53 (nl <0.5), K 3.4, and Na 146. CXR and XR neck w/o acute findings. EDP administered racemic epinephrine for stridor, consulted ENT (Dr. Roark) for stridor, and requested [facility] medicine admission.   Review of Systems: As mentioned  in the history of present illness. All other systems reviewed and are negative. Past Medical History:  Diagnosis Date   Anxiety    GERD    Personal history of colonic polyps, hyperplastic    Past Surgical History:  Procedure Laterality Date   CARPAL TUNNEL RELEASE Left    REPLACEMENT TOTAL KNEE Right 2008   TUBAL LIGATION     Social History:  reports that she has never smoked. She has never used smokeless tobacco. She reports that she does not drink alcohol and does not use drugs.  Allergies  Allergen Reactions   Codeine Itching    Takes it with Benadryl  and tolerates it well.   Penicillins Rash    Did it involve swelling of the face/tongue/throat, SOB, or low BP? No Did it involve sudden or severe rash/hives, skin peeling, or any reaction on the inside of your mouth or nose? No Did you need to seek medical attention at a hospital or doctor's office? No When did it last happen?      childhood If all above answers are "NO", may proceed with cephalosporin use.     Family History  Problem Relation Age of Onset   Hypertension Mother    Breast cancer Mother 26   Hypertension Father    Diabetes Father    Hypertension Brother    Hypertension Brother    Hypertension Brother    Colon cancer Neg Hx    Esophageal cancer Neg Hx    Rectal cancer Neg Hx    Stomach cancer Neg Hx     Prior to Admission medications   Medication Sig Start Date End  Date Taking? Authorizing Provider  acetaminophen  (TYLENOL ) 325 MG tablet Take 650 mg by mouth every 6 (six) hours as needed.    [provider]  albuterol  (VENTOLIN  HFA) 108 (90 Base) MCG/ACT inhaler Inhale 2 puffs into the lungs every 6 (six) hours as needed. 12/16/22   [provider]  atorvastatin  (LIPITOR) 20 MG tablet Take 1 tablet (20 mg total) by mouth daily. Patient not taking: Reported on 12/22/2023 09/16/18   Mona Vinie BROCKS, MD  celecoxib (CELEBREX) 200 MG capsule Take 200 mg by mouth as needed. 10/19/23    [provider]  dexlansoprazole  (DEXILANT ) 60 MG capsule Take 1 capsule (60 mg total) by mouth daily. 08/17/18   Prentiss Frieze, DO  diazepam  (VALIUM ) 5 MG tablet Take 5 mg by mouth every 8 (eight) hours as needed. 10/23/22   [provider]  hydrochlorothiazide  (HYDRODIURIL ) 12.5 MG tablet Take 12.5 mg by mouth daily. 10/25/21   [provider]  HYDROcodone -acetaminophen  (NORCO/VICODIN) 5-325 MG tablet Take 1 tablet by mouth every 6 (six) hours as needed. 08/17/23   [provider]  ibuprofen  (ADVIL ) 800 MG tablet Take 800 mg by mouth every 6 (six) hours as needed. 02/24/12   [provider]  metoCLOPramide  (REGLAN ) 10 MG tablet Take 1 tablet (10 mg total) by mouth every 6 (six) hours. Patient not taking: Reported on 12/22/2023 11/02/21   Conklin, Erica R, PA-C  mometasone  (ELOCON ) 0.1 % cream APPLY TO AFFECTED AREA AS NEEDED 12/18/17   Scarlet Elsie LABOR, MD  OMEPRAZOLE PO Take 1 tablet by mouth daily.    [provider]  ramelteon  (ROZEREM ) 8 MG tablet Take 1 tablet (8 mg total) by mouth at bedtime. 12/22/23   Theodoro Lakes, MD    Physical Exam: Vitals:   12/26/23 0700 12/26/23 0755 12/26/23 0810 12/26/23 0928  BP: (!) 155/80  135/61 (!) 122/55  Pulse: (!) 120  (!) 113 (!) 110  Resp: (!) 29  (!) 23 18  Temp:   97.8 F (36.6 C) 98.5 F (36.9 C)  TempSrc:   Oral   SpO2: 95% 95% 95% 95%   General: Alert, oriented x3, resting comfortably in no acute distress HEENT: EOMI, oropharynx clear, moist mucous membranes, hearing intact Neck: Trachea midline and no gross thyromegaly Respiratory: Lungs clear to auscultation bilaterally with normal respiratory effort; no w/r/r Cardiovascular: Regular rate and rhythm w/o m/r/g Abdomen: Soft, nontender, nondistended. Positive bowel sounds MSK: No obvious joint deformities or swelling Skin: No obvious rashes or lesions Neurologic: Awake, alert, spontaneously moves all extremities, strength  intact Psychiatric: Appropriate mood and affect, conversational and cooperative   Data Reviewed:  Lab Results  Component Value Date   WBC 8.7 12/26/2023   HGB 13.2 12/26/2023   HCT 41.5 12/26/2023   MCV 88.1 12/26/2023   PLT 331 12/26/2023   Lab Results  Component Value Date   GLUCOSE 113 (H) 12/26/2023   CALCIUM  10.4 (H) 12/26/2023   NA 146 (H) 12/26/2023   K 3.4 (L) 12/26/2023   CO2 29 12/26/2023   CL 108 12/26/2023   BUN 18 12/26/2023   CREATININE 0.60 12/26/2023   Lab Results  Component Value Date   ALT 22 12/26/2023   AST 23 12/26/2023   ALKPHOS 88 12/26/2023   BILITOT <0.2 12/26/2023   No results found for: INR, PROTIME Radiology: DG Neck Soft Tissue Result Date: 12/26/2023 EXAM: 1 VIEW XRAY OF THE SOFT TISSUE NECK 12/26/2023 06:58:54 AM COMPARISON: None available. CLINICAL HISTORY: stridor FINDINGS: SOFT TISSUES:  Unremarkable. No retropharyngeal soft tissue swelling or gas. EPIGLOTTIS: No epiglottic thickening. BONES: There is moderate chronic degenerative disc disease at C3-C4, C4-C5, C5-C6 and C6-C7. There are prominent anterior osteophytes present at each of these levels. IMPRESSION: 1. No acute findings. 2. 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 Electronically signed by: Evalene Coho MD 12/26/2023 07:05 AM EST RP Workstation: [id number]   DG Chest Port 1 View Result Date: 12/26/2023 EXAM: 1 VIEW(S) XRAY OF THE CHEST 12/26/2023 04:37:00 AM COMPARISON: PA and lateral radiographs of the chest dated 12/28/2017. CLINICAL HISTORY: SOB (shortness of breath) FINDINGS: LUNGS AND PLEURA: No focal pulmonary opacity. No pleural effusion. No pneumothorax. HEART AND MEDIASTINUM: No acute abnormality of the cardiac and mediastinal silhouettes. BONES AND SOFT TISSUES: No acute osseous abnormality. IMPRESSION: 1. No acute process. Electronically signed by: Evalene Coho MD 12/26/2023 04:39 AM EST RP  Workstation: [id number]    Assessment and Plan: 76F h/o anxiety, HTN, HLD, GERD, impaired fasting glucose, obesity (class II), vitamin D  deficiency, R knee OA (on oxycodone  prn), peripheral neuropathy (on gabapentin  prn), and recent OP Pulmonology evaluation on 11/25 w/ suspected OSA (follows w/ Dr. Theodoro who ordered OP sleep study, as well as Rozerem  for sleep onset given c/f delayed sleep phase disorder) who presented to the emergency department with complaints of progressively worse dyspnea, nonproductive cough and wheezing.  DOE/SOB Nonproductive cough Presumed viral URI Presumed reactive airway disease Admission nasal swab neg for COVID, flu, and RSV -MIVF: NS at 100cc/hr for now -Duonebs q6h sch for now -Prednisone  40mg  daily for now; wean as tolerated -Montelukast 10mg  nightly and Flonase daily -Tessalon perles 100mg  TID sch for now -Guaifenesin 300mg  QID prn for now -F/u complete RVP -F/u full PFTs; consider pulm consult pending results -OP Pulm f/u as planned for polysomnography; may need PFTs -Consider OP allergy testing to clarify triggers  Stridor -ENT consulted; apprec eval/recs  Hypokalemia -Kdur 40mEq once -F/u Mg -BMP daily and replete prn  HTN -PTA hydrochlorothiazide 12.5mg  daily  HLD -PTA atorvastatin  20mg  daily  GERD -PTA PPI  Delayed onset sleep disorder Insomnia -PTA Rozerem  8mg  nightly   Advance Care Planning:   Code Status: Full Code   Consults: ENT  Family Communication: Daughter  Severity of Illness: The appropriate patient status for this patient is OBSERVATION. Observation status is judged to be reasonable and necessary in order to provide the required intensity of service to ensure the patient's safety. The patient's presenting symptoms, physical exam findings, and initial radiographic and laboratory data in the context of their medical condition is felt to place them at decreased risk for further clinical deterioration. Furthermore, it  is anticipated that the patient will be medically stable for discharge from the hospital within 2 midnights of admission.    ------- I spent 55 minutes reviewing previous notes, at the bedside counseling/discussing the treatment plan, and performing clinical documentation.  Author: Marsha Ada, MD 12/26/2023 10:44 AM  For on call review [email or web address].

## 2023-12-26 NOTE — ED Provider Notes (Signed)
  Physical Exam  BP 124/71   Pulse (!) 110   Temp (!) 97.4 F (36.3 C)   Resp (!) 21   SpO2 100%   Physical Exam  Procedures  Procedures  ED Course / MDM   Clinical Course as of 12/26/23 0750  Sat Dec 26, 2023  0450 D-Dimer, Quant(!): 0.53 D-dimer normal after age adjustment [JL]  0653 Assumed care from Dr Jerrol. 78 yo F hx of asthma pw 1 mo of SOB. Had diffuse wheezing on arrival but in NAD. Needs to be admitted for asthma exacerbation. On reassessment had some stridor but is not in acute distress. Neck x-ray is pending. Discussed with ENT (Dr Roark) who will perform a scope this admission. Giving racemic epi. Was discussed with hospitalist who wants a call back after the neck x-ray. [RP]  0747 Neck x-ray negative for acute findings.  Dr Celinda from hospitalist consulted for admission.  Patient reevaluated.  No persistent stridor.  Does still have some wheezing.  Satting well on room air and not in acute distress. [RP]    Clinical Course User Index [JL] Jerrol Agent, MD [RP] Yolande Lamar BROCKS, MD   Medical Decision Making Amount and/or Complexity of Data Reviewed Labs: ordered. Decision-making details documented in ED Course. Radiology: ordered.  Risk OTC drugs. Prescription drug management. Decision regarding hospitalization.      Yolande Lamar BROCKS, MD 12/26/23 [phone]

## 2023-12-26 NOTE — Plan of Care (Signed)

## 2023-12-26 NOTE — Consult Note (Signed)
 Reason for Consult:wheezing Referring Physician: Dr Georgina Alisa JONETTA Patricia Ramos is an 78 y.o. female.  HPI: hx of 3-4 weeks of wheezing. She has had this many times previously. She has not had a distress recently but today got somewhat worse. She is comfortable now with very low wheezing sound. She is speaking normally. No stridor with deep inspiration or expiration. No pain.   Past Medical History:  Diagnosis Date   Anxiety    GERD    Personal history of colonic polyps, hyperplastic     Past Surgical History:  Procedure Laterality Date   CARPAL TUNNEL RELEASE Left    REPLACEMENT TOTAL KNEE Right 2008   TUBAL LIGATION      Family History  Problem Relation Age of Onset   Hypertension Mother    Breast cancer Mother 34   Hypertension Father    Diabetes Father    Hypertension Brother    Hypertension Brother    Hypertension Brother    Colon cancer Neg Hx    Esophageal cancer Neg Hx    Rectal cancer Neg Hx    Stomach cancer Neg Hx     Social History:  reports that she has never smoked. She has never used smokeless tobacco. She reports that she does not drink alcohol and does not use drugs.  Allergies:  Allergies  Allergen Reactions   Codeine Itching    Takes it with Benadryl  and tolerates it well.   Penicillins Rash    Did it involve swelling of the face/tongue/throat, SOB, or low BP? No Did it involve sudden or severe rash/hives, skin peeling, or any reaction on the inside of your mouth or nose? No Did you need to seek medical attention at a hospital or doctor's office? No When did it last happen?      childhood If all above answers are "NO", may proceed with cephalosporin use.     Medications: I have reviewed the patient's current medications.  Results for orders placed or performed during the hospital encounter of 12/26/23 (from the past 48 hours)  Basic metabolic panel     Status: Abnormal   Collection Time: 12/26/23  4:21 AM  Result Value Ref Range   Sodium 146 (H)  135 - 145 mmol/L   Potassium 3.4 (L) 3.5 - 5.1 mmol/L   Chloride 108 98 - 111 mmol/L   CO2 29 22 - 32 mmol/L   Glucose, Bld 113 (H) 70 - 99 mg/dL    Comment: Glucose reference range applies only to samples taken after fasting for at least 8 hours.   BUN 18 8 - 23 mg/dL   Creatinine, Ser 9.39 0.44 - 1.00 mg/dL   Calcium  10.4 (H) 8.9 - 10.3 mg/dL   GFR, Estimated >39 >39 mL/min    Comment: (NOTE) Calculated using the CKD-EPI Creatinine Equation (2021)    Anion gap 9 5 - 15    Comment: Performed at [facility], [address] [place], [place] [address]  CBC with Differential     Status: Abnormal   Collection Time: 12/26/23  4:21 AM  Result Value Ref Range   WBC 8.7 4.0 - 10.5 K/uL   RBC 4.71 3.87 - 5.11 MIL/uL   Hemoglobin 13.2 12.0 - 15.0 g/dL   HCT 58.4 63.9 - 53.9 %   MCV 88.1 80.0 - 100.0 fL   MCH 28.0 26.0 - 34.0 pg   MCHC 31.8 30.0 - 36.0 g/dL   RDW 87.1 88.4 - 84.4 %  Platelets 331 150 - 400 K/uL   nRBC 0.0 0.0 - 0.2 %   Neutrophils Relative % 46 %   Neutro Abs 4.1 1.7 - 7.7 K/uL   Lymphocytes Relative 34 %   Lymphs Abs 3.0 0.7 - 4.0 K/uL   Monocytes Relative 9 %   Monocytes Absolute 0.8 0.1 - 1.0 K/uL   Eosinophils Relative 9 %   Eosinophils Absolute 0.8 (H) 0.0 - 0.5 K/uL   Basophils Relative 1 %   Basophils Absolute 0.1 0.0 - 0.1 K/uL   Immature Granulocytes 1 %   Abs Immature Granulocytes 0.05 0.00 - 0.07 K/uL    Comment: Performed at [facility], [address] [place], [place] [address]  Brain natriuretic peptide     Status: None   Collection Time: 12/26/23  4:21 AM  Result Value Ref Range   Pro Brain Natriuretic Peptide 60.0 <300.0 pg/mL    Comment: (NOTE) Age Group        Cut-Points    Interpretation  < 50 years     450 pg/mL       NT-proBNP > 450 pg/mL indicates                                ADHF is likely              50 to 75 years  900 pg/mL      NT-proBNP > 900 pg/mL indicates          ADHF is  likely  > 75 years      1800 pg/mL     NT-proBNP > 1800 pg/mL indicates          ADHF is likely                           All ages    Results between       Indeterminate. Further clinical             300 and the cut-   information is needed to determine            point for age group   if ADHF is present.                                                             Elecsys proBNP II/ Elecsys proBNP II STAT           Cut-Point                       Interpretation  300 pg/mL                    NT-proBNP <300pg/mL indicates                             ADHF is not likely  Performed at [facility], [address] [place], [place] [address]   Resp panel by RT-PCR (RSV, Flu A&B, Covid) Anterior Nasal Swab     Status: None   Collection Time: 12/26/23  4:21 AM   Specimen: Anterior Nasal Swab  Result Value Ref Range  SARS Coronavirus 2 by RT PCR NEGATIVE NEGATIVE    Comment: (NOTE) SARS-CoV-2 target nucleic acids are NOT DETECTED.  The SARS-CoV-2 RNA is generally detectable in upper respiratory specimens during the acute phase of infection. The lowest concentration of SARS-CoV-2 viral copies this assay can detect is 138 copies/mL. A negative result does not preclude SARS-Cov-2 infection and should not be used as the sole basis for treatment or other patient management decisions. A negative result may occur with  improper specimen collection/handling, submission of specimen other than nasopharyngeal swab, presence of viral mutation(s) within the areas targeted by this assay, and inadequate number of viral copies(<138 copies/mL). A negative result must be combined with clinical observations, patient history, and epidemiological information. The expected result is Negative.  Fact Sheet for Patients:  bloggercourse.com  Fact Sheet for Healthcare Providers:  seriousbroker.it  This test is no t yet approved or  cleared by the [place]  FDA and  has been authorized for detection and/or diagnosis of SARS-CoV-2 by FDA under an Emergency Use Authorization (EUA). This EUA will remain  in effect (meaning this test can be used) for the duration of the COVID-19 declaration under Section 564(b)(1) of the Act, 21 U.S.C.section 360bbb-3(b)(1), unless the authorization is terminated  or revoked sooner.       Influenza A by PCR NEGATIVE NEGATIVE   Influenza B by PCR NEGATIVE NEGATIVE    Comment: (NOTE) The Xpert Xpress SARS-CoV-2/FLU/RSV plus assay is intended as an aid in the diagnosis of influenza from Nasopharyngeal swab specimens and should not be used as a sole basis for treatment. Nasal washings and aspirates are unacceptable for Xpert Xpress SARS-CoV-2/FLU/RSV testing.  Fact Sheet for Patients: bloggercourse.com  Fact Sheet for Healthcare Providers: seriousbroker.it  This test is not yet approved or cleared by the [place]  FDA and has been authorized for detection and/or diagnosis of SARS-CoV-2 by FDA under an Emergency Use Authorization (EUA). This EUA will remain in effect (meaning this test can be used) for the duration of the COVID-19 declaration under Section 564(b)(1) of the Act, 21 U.S.C. section 360bbb-3(b)(1), unless the authorization is terminated or revoked.     Resp Syncytial Virus by PCR NEGATIVE NEGATIVE    Comment: (NOTE) Fact Sheet for Patients: bloggercourse.com  Fact Sheet for Healthcare Providers: seriousbroker.it  This test is not yet approved or cleared by the [place]  FDA and has been authorized for detection and/or diagnosis of SARS-CoV-2 by FDA under an Emergency Use Authorization (EUA). This EUA will remain in effect (meaning this test can be used) for the duration of the COVID-19 declaration under Section 564(b)(1) of the Act, 21 U.S.C. section  360bbb-3(b)(1), unless the authorization is terminated or revoked.  Performed at [facility], [address] [place], [place] [address]   D-dimer, quantitative     Status: Abnormal   Collection Time: 12/26/23  4:21 AM  Result Value Ref Range   D-Dimer, Quant 0.53 (H) 0.00 - 0.50 ug/mL-FEU    Comment: (NOTE) At the manufacturer cut-off value of 0.5 g/mL FEU, this assay has a negative predictive value of 95-100%.This assay is intended for use in conjunction with a clinical pretest probability (PTP) assessment model to exclude pulmonary embolism (PE) and deep venous thrombosis (DVT) in outpatients suspected of PE or DVT. Results should be correlated with clinical presentation. Performed at [facility], [address], [place], [place] [address]   Troponin T, High Sensitivity     Status: None   Collection Time: 12/26/23  4:21 AM  Result Value Ref Range   Troponin T High Sensitivity <15 0 - 19 ng/L    Comment: (NOTE) Biotin concentrations > 1000 ng/mL falsely decrease TnT results.  Serial cardiac troponin measurements are suggested.  Refer to the Links section for chest pain algorithms and additional  guidance. Performed at [facility], [address], [place], [place] [address]   Hepatic function panel     Status: None   Collection Time: 12/26/23  4:21 AM  Result Value Ref Range   Total Protein 7.0 6.5 - 8.1 g/dL   Albumin 4.1 3.5 - 5.0 g/dL   AST 23 15 - 41 U/L   ALT 22 0 - 44 U/L   Alkaline Phosphatase 88 38 - 126 U/L   Total Bilirubin <0.2 0.0 - 1.2 mg/dL   Bilirubin, Direct <9.8 0.0 - 0.2 mg/dL   Indirect Bilirubin NOT CALCULATED 0.3 - 0.9 mg/dL    Comment: Performed at [facility], [address], [place], [place] [address]    DG Neck Soft Tissue Result Date: 12/26/2023 EXAM: 1 VIEW XRAY OF THE SOFT TISSUE NECK 12/26/2023 06:58:54 AM COMPARISON: None available. CLINICAL HISTORY:  stridor FINDINGS: SOFT TISSUES: Unremarkable. No retropharyngeal soft tissue swelling or gas. EPIGLOTTIS: No epiglottic thickening. BONES: There is moderate chronic degenerative disc disease at C3-C4, C4-C5, C5-C6 and C6-C7. There are prominent anterior osteophytes present at each of these levels. IMPRESSION: 1. No acute findings. 2. 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 Electronically signed by: Evalene Coho MD 12/26/2023 07:05 AM EST RP Workstation: [id number]   DG Chest Port 1 View Result Date: 12/26/2023 EXAM: 1 VIEW(S) XRAY OF THE CHEST 12/26/2023 04:37:00 AM COMPARISON: PA and lateral radiographs of the chest dated 12/28/2017. CLINICAL HISTORY: SOB (shortness of breath) FINDINGS: LUNGS AND PLEURA: No focal pulmonary opacity. No pleural effusion. No pneumothorax. HEART AND MEDIASTINUM: No acute abnormality of the cardiac and mediastinal silhouettes. BONES AND SOFT TISSUES: No acute osseous abnormality. IMPRESSION: 1. No acute process. Electronically signed by: Evalene Coho MD 12/26/2023 04:39 AM EST RP Workstation: [id number]    ROS Blood pressure (!) 122/55, pulse (!) 110, temperature 98.5 F (36.9 C), resp. rate 18, SpO2 95%. Physical Exam Constitutional:      Appearance: Normal appearance.  HENT:     Head: Normocephalic and atraumatic.     Right Ear: Tympanic membrane is without lesions and middle ear aerated, ear canal and external ear normal.     Left Ear: Tympanic membrane is without lesions and middle ear aerated, ear canal and external ear normal.     Nose: Nose normal. Turbinates with mild hypertrophy, No significant swelling or masses.     Oral cavity/oropharynx: Mucous membranes are moist. No lesions or masses    Larynx: she speaking calmly and without distress. normal voice. No stridor with deep inspiration or expiratory. Has wheezing that sounds pulmonary. Mirror attempted without success    Eyes:      Extraocular Movements: Extraocular movements intact.     Conjunctiva/sclera: Conjunctivae normal.     Pupils: Pupils are equal, round, and reactive to light.  Cardiovascular:     Rate and Rhythm: Normal rate.  Pulmonary:     Effort: Pulmonary effort is normal.  Musculoskeletal:     Cervical back: Normal range of motion and neck supple. No rigidity.  Lymphadenopathy:     Cervical: No cervical adenopathy or masses.salivary glands without lesions. .  Neurological:  Mental Status: He is alert. CN 2-12 intact. No nystagmus      Assessment/Plan: Respiratory distress- she has hx of wheezing for long time and it fluctuates. Worse today but still does not sound like upper airway. I discussed fibroptic scope and she prefers to hold off. Will see how the pulmonary treatment works. No distress currently .   Norleen Notice 12/26/2023, 10:17 AM

## 2023-12-27 DIAGNOSIS — J45909 Unspecified asthma, uncomplicated: Secondary | ICD-10-CM | POA: Diagnosis not present

## 2023-12-27 LAB — BASIC METABOLIC PANEL WITH GFR
Anion gap: 14 (ref 5–15)
BUN: 10 mg/dL (ref 8–23)
CO2: 26 mmol/L (ref 22–32)
Calcium: 9.3 mg/dL (ref 8.9–10.3)
Chloride: 100 mmol/L (ref 98–111)
Creatinine, Ser: 0.78 mg/dL (ref 0.44–1.00)
GFR, Estimated: >60 mL/min (ref >60)
Glucose, Bld: 130 mg/dL — ABNORMAL HIGH (ref 70–99)
Potassium: 3.9 mmol/L (ref 3.5–5.1)
Sodium: 140 mmol/L (ref 135–145)

## 2023-12-27 LAB — CBC
HCT: 38.8 % (ref 36.0–46.0)
Hemoglobin: 12.5 g/dL (ref 12.0–15.0)
MCH: 28.3 pg (ref 26.0–34.0)
MCHC: 32.2 g/dL (ref 30.0–36.0)
MCV: 87.8 fL (ref 80.0–100.0)
Platelets: 329 K/uL (ref 150–400)
RBC: 4.42 MIL/uL (ref 3.87–5.11)
RDW: 13 % (ref 11.5–15.5)
WBC: 14.9 K/uL — ABNORMAL HIGH (ref 4.0–10.5)
nRBC: 0 % (ref 0.0–0.2)

## 2023-12-27 NOTE — Plan of Care (Signed)

## 2023-12-27 NOTE — Care Management Obs Status (Signed)
 MEDICARE OBSERVATION STATUS NOTIFICATION   Patient Details  Name: Patricia Ramos MRN: [id number] Date of Birth: December 13, 1945   Medicare Observation Status Notification Given:  Yes    Yavonne Kiss G., RN 12/27/2023, 9:10 AM

## 2023-12-27 NOTE — Plan of Care (Signed)
  Problem: Clinical Measurements: Goal: Will remain free from infection Outcome: Progressing Goal: Diagnostic test results will improve Outcome: Progressing Goal: Respiratory complications will improve Outcome: Progressing Goal: Cardiovascular complication will be avoided Outcome: Progressing   Problem: Coping: Goal: Level of anxiety will decrease Outcome: Progressing   Problem: Pain Managment: Goal: General experience of comfort will improve and/or be controlled Outcome: Progressing   Problem: Safety: Goal: Ability to remain free from injury will improve Outcome: Progressing

## 2023-12-27 NOTE — Progress Notes (Addendum)
 PROGRESS NOTE    Patricia Ramos  FMW:[id number] DOB: 1945/03/05 DOA: 12/26/2023 PCP: Claudene Prentice [name]., FNP  Subjective:  Noted to have ongoing wheezing overnight and started on pulmicort. Seen and examined at bedside. Reports feeling better with improvement in stridor. Tolerating oral intake without n/v. Denies constipation.   Hospital Course:  78 y.o. female with medical history significant of anxiety, HTN, HLD, GERD, impaired fasting glucose, obesity (class II), vitamin D  deficiency, R knee OA (on oxycodone  prn), peripheral neuropathy (on gabapentin  prn), and recent OP Pulmonology evaluation on 11/25 w/ suspected OSA (follows w/ Dr. Theodoro who ordered OP sleep study, as well as Rozerem  for sleep onset given c/f delayed sleep phase disorder) who presented to the emergency department with complaints of progressively worse dyspnea, nonproductive cough and wheezing. ENT evaluated with low concern for upper airway pathology. Patient wanted to hold off on fibroptic scopy for upper airway evaluation. Admitted for possible acute asthma exacerbation.    Assessment and Plan:  Acute rhinovirus infection Possible asthma exacerbation  - Admission nasal swab neg for COVID, flu, and RSV - RVP positive for rhinovirus -off IV fluids now  -Duonebs q6h sch for now -Prednisone  40mg  daily for now; wean as tolerated -Montelukast 10mg  nightly and Flonase daily - cont pulmicort -Tessalon perles 100mg  TID sch for now -Guaifenesin 300mg  QID prn for now - will need outpatient PFTs once acute viral illness resolves - will need outpatient polysomnography as scheduled - will need to follow up with pulmonology outpatient    Stridor - resolved spontaneously -ENT evaluated with low concern for upper airway pathology  - monitor clinically   Hypokalemia Resolved with PO supplementation -monitor and replete electrolytes as needed   HTN -cont hydrochlorothiazide 12.5mg  daily   HLD -cont atorvastatin  20mg   daily   GERD -PTA PPI   Delayed onset sleep disorder Insomnia -PTA Rozerem  8mg  nightly  DVT prophylaxis:   Lovenox   Code Status: Full Code Family Communication: Updated daughter at bedside Disposition Plan: Home Reason for continuing need for hospitalization: severity of illness  Objective: Vitals:   12/27/23 0216 12/27/23 0450 12/27/23 0747 12/27/23 0842  BP:  131/79 129/75   Pulse: 95 (!) 101 94 99  Resp: 16 20 17 16   Temp:  98.5 F (36.9 C) 98.3 F (36.8 C)   TempSrc:  Oral Oral   SpO2: 99% 95% 97%   Weight:      Height:       No intake or output data in the 24 hours ending 12/27/23 1110 Filed Weights   12/26/23 2348  Weight: 100.3 kg    Examination:  Physical Exam Vitals and nursing note reviewed.  Constitutional:      General: She is not in acute distress.    Appearance: She is obese. She is ill-appearing.  HENT:     Head: Normocephalic and atraumatic.  Cardiovascular:     Rate and Rhythm: Normal rate and regular rhythm.     Pulses: Normal pulses.     Heart sounds: Normal heart sounds.  Pulmonary:     Effort: No respiratory distress.     Breath sounds: Wheezing (mild expiratory) present.  Abdominal:     General: Bowel sounds are normal.     Palpations: Abdomen is soft.  Musculoskeletal:     Right lower leg: No edema.     Left lower leg: No edema.  Neurological:     Mental Status: She is alert. Mental status is at baseline.  Data Reviewed: I have personally reviewed following labs and imaging studies  CBC: Recent Labs  Lab 12/26/23 0421  WBC 8.7  NEUTROABS 4.1  HGB 13.2  HCT 41.5  MCV 88.1  PLT 331   Basic Metabolic Panel: Recent Labs  Lab 12/26/23 0421 12/26/23 1015 12/27/23 0824  NA 146* 146* 140  K 3.4* 3.2* 3.9  CL 108 108 100  CO2 29 25 26   GLUCOSE 113* 154* 130*  BUN 18 14 10   CREATININE 0.60 0.77 0.78  CALCIUM  10.4* 9.3 9.3  MG  --  2.1  --    GFR: Estimated Creatinine Clearance: 69.3 mL/min (by C-G formula  based on SCr of 0.78 mg/dL). Liver Function Tests: Recent Labs  Lab 12/26/23 0421  AST 23  ALT 22  ALKPHOS 88  BILITOT <0.2  PROT 7.0  ALBUMIN 4.1   No results for input(s): LIPASE, AMYLASE in the last 168 hours. No results for input(s): AMMONIA in the last 168 hours. Coagulation Profile: No results for input(s): INR, PROTIME in the last 168 hours. Cardiac Enzymes: No results for input(s): CKTOTAL, CKMB, CKMBINDEX, TROPONINI in the last 168 hours. ProBNP, BNP (last 5 results) Recent Labs    12/26/23 0421  PROBNP 60.0   HbA1C: No results for input(s): HGBA1C in the last 72 hours. CBG: No results for input(s): GLUCAP in the last 168 hours. Lipid Profile: No results for input(s): CHOL, HDL, LDLCALC, TRIG, CHOLHDL, LDLDIRECT in the last 72 hours. Thyroid  Function Tests: No results for input(s): TSH, T4TOTAL, FREET4, T3FREE, THYROIDAB in the last 72 hours. Anemia Panel: No results for input(s): VITAMINB12, FOLATE, FERRITIN, TIBC, IRON, RETICCTPCT in the last 72 hours. Sepsis Labs: Recent Labs  Lab 12/26/23 1015  PROCALCITON <0.10    Recent Results (from the past 240 hours)  Resp panel by RT-PCR (RSV, Flu A&B, Covid) Anterior Nasal Swab     Status: None   Collection Time: 12/26/23  4:21 AM   Specimen: Anterior Nasal Swab  Result Value Ref Range Status   SARS Coronavirus 2 by RT PCR NEGATIVE NEGATIVE Final    Comment: (NOTE) SARS-CoV-2 target nucleic acids are NOT DETECTED.  The SARS-CoV-2 RNA is generally detectable in upper respiratory specimens during the acute phase of infection. The lowest concentration of SARS-CoV-2 viral copies this assay can detect is 138 copies/mL. A negative result does not preclude SARS-Cov-2 infection and should not be used as the sole basis for treatment or other patient management decisions. A negative result may occur with  improper specimen collection/handling, submission of  specimen other than nasopharyngeal swab, presence of viral mutation(s) within the areas targeted by this assay, and inadequate number of viral copies(<138 copies/mL). A negative result must be combined with clinical observations, patient history, and epidemiological information. The expected result is Negative.  Fact Sheet for Patients:  bloggercourse.com  Fact Sheet for Healthcare Providers:  seriousbroker.it  This test is no t yet approved or cleared by the [place]  FDA and  has been authorized for detection and/or diagnosis of SARS-CoV-2 by FDA under an Emergency Use Authorization (EUA). This EUA will remain  in effect (meaning this test can be used) for the duration of the COVID-19 declaration under Section 564(b)(1) of the Act, 21 U.S.C.section 360bbb-3(b)(1), unless the authorization is terminated  or revoked sooner.       Influenza A by PCR NEGATIVE NEGATIVE Final   Influenza B by PCR NEGATIVE NEGATIVE Final    Comment: (NOTE) The Xpert Xpress SARS-CoV-2/FLU/RSV plus assay is  intended as an aid in the diagnosis of influenza from Nasopharyngeal swab specimens and should not be used as a sole basis for treatment. Nasal washings and aspirates are unacceptable for Xpert Xpress SARS-CoV-2/FLU/RSV testing.  Fact Sheet for Patients: bloggercourse.com  Fact Sheet for Healthcare Providers: seriousbroker.it  This test is not yet approved or cleared by the [place]  FDA and has been authorized for detection and/or diagnosis of SARS-CoV-2 by FDA under an Emergency Use Authorization (EUA). This EUA will remain in effect (meaning this test can be used) for the duration of the COVID-19 declaration under Section 564(b)(1) of the Act, 21 U.S.C. section 360bbb-3(b)(1), unless the authorization is terminated or revoked.     Resp Syncytial Virus by PCR NEGATIVE NEGATIVE Final     Comment: (NOTE) Fact Sheet for Patients: bloggercourse.com  Fact Sheet for Healthcare Providers: seriousbroker.it  This test is not yet approved or cleared by the [place]  FDA and has been authorized for detection and/or diagnosis of SARS-CoV-2 by FDA under an Emergency Use Authorization (EUA). This EUA will remain in effect (meaning this test can be used) for the duration of the COVID-19 declaration under Section 564(b)(1) of the Act, 21 U.S.C. section 360bbb-3(b)(1), unless the authorization is terminated or revoked.  Performed at [facility], [address], [place], [place] [address]   Respiratory (~20 pathogens) panel by PCR     Status: Abnormal   Collection Time: 12/26/23 10:32 AM   Specimen: Nasopharyngeal Swab; Respiratory  Result Value Ref Range Status   Adenovirus NOT DETECTED NOT DETECTED Final   Coronavirus 229E NOT DETECTED NOT DETECTED Final    Comment: (NOTE) The Coronavirus on the Respiratory Panel, DOES NOT test for the novel  Coronavirus (2019 nCoV)    Coronavirus HKU1 NOT DETECTED NOT DETECTED Final   Coronavirus NL63 NOT DETECTED NOT DETECTED Final   Coronavirus OC43 NOT DETECTED NOT DETECTED Final   Metapneumovirus NOT DETECTED NOT DETECTED Final   Rhinovirus / Enterovirus DETECTED (A) NOT DETECTED Final   Influenza A NOT DETECTED NOT DETECTED Final   Influenza B NOT DETECTED NOT DETECTED Final   Parainfluenza Virus 1 NOT DETECTED NOT DETECTED Final   Parainfluenza Virus 2 NOT DETECTED NOT DETECTED Final   Parainfluenza Virus 3 NOT DETECTED NOT DETECTED Final   Parainfluenza Virus 4 NOT DETECTED NOT DETECTED Final   Respiratory Syncytial Virus NOT DETECTED NOT DETECTED Final   Bordetella pertussis NOT DETECTED NOT DETECTED Final   Bordetella Parapertussis NOT DETECTED NOT DETECTED Final   Chlamydophila pneumoniae NOT DETECTED NOT DETECTED Final   Mycoplasma pneumoniae NOT  DETECTED NOT DETECTED Final    Comment: Performed at [facility], [address] [place], [place] [address]     Radiology Studies: DG Neck Soft Tissue Result Date: 12/26/2023 EXAM: 1 VIEW XRAY OF THE SOFT TISSUE NECK 12/26/2023 06:58:54 AM COMPARISON: None available. CLINICAL HISTORY: stridor FINDINGS: SOFT TISSUES: Unremarkable. No retropharyngeal soft tissue swelling or gas. EPIGLOTTIS: No epiglottic thickening. BONES: There is moderate chronic degenerative disc disease at C3-C4, C4-C5, C5-C6 and C6-C7. There are prominent anterior osteophytes present at each of these levels. IMPRESSION: 1. No acute findings. 2. 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 Electronically signed by: Evalene Coho MD 12/26/2023 07:05 AM EST RP Workstation: [id number]   DG Chest Port 1 View Result Date: 12/26/2023 EXAM: 1 VIEW(S) XRAY OF THE CHEST 12/26/2023 04:37:00 AM COMPARISON: PA and lateral radiographs of the chest dated  12/28/2017. CLINICAL HISTORY: SOB (shortness of breath) FINDINGS: LUNGS AND PLEURA: No focal pulmonary opacity. No pleural effusion. No pneumothorax. HEART AND MEDIASTINUM: No acute abnormality of the cardiac and mediastinal silhouettes. BONES AND SOFT TISSUES: No acute osseous abnormality. IMPRESSION: 1. No acute process. Electronically signed by: Timothy Berrigan MD 12/26/2023 04:39 AM EST RP Workstation: [id number]    Scheduled Meds:  atorvastatin   20 mg Oral Daily   benzonatate  100 mg Oral TID   budesonide (PULMICORT) nebulizer solution  0.25 mg Nebulization BID   enoxaparin (LOVENOX) injection  0.5 mg/kg Subcutaneous Q24H   fluticasone  1 spray Each Nare Daily   hydrochlorothiazide  12.5 mg Oral Daily   ipratropium-albuterol   3 mL Nebulization Q6H   montelukast  10 mg Oral QHS   pantoprazole  40 mg Oral Daily   predniSONE   40 mg Oral Q breakfast   ramelteon   8 mg Oral QHS   Continuous Infusions:   LOS:  0 days   Norval Bar, MD  [facility]  12/27/2023, 11:10 AM

## 2023-12-28 DIAGNOSIS — J45909 Unspecified asthma, uncomplicated: Secondary | ICD-10-CM | POA: Diagnosis not present

## 2023-12-28 MED ORDER — BENZONATATE 100 MG PO CAPS: 100.0000 mg | ORAL_CAPSULE | Freq: Three times a day (TID) | ORAL | 0 refills | Status: AC

## 2023-12-28 MED ORDER — GUAIFENESIN 100 MG/5ML PO LIQD: 15.0000 mL | Freq: Four times a day (QID) | ORAL | 0 refills | Status: AC | PRN

## 2023-12-28 MED ORDER — PREDNISONE 20 MG PO TABS: 40.0000 mg | ORAL_TABLET | Freq: Every day | ORAL | 0 refills | Status: AC

## 2023-12-28 NOTE — Plan of Care (Signed)

## 2023-12-28 NOTE — Plan of Care (Signed)

## 2023-12-28 NOTE — Discharge Summary (Signed)
 [facility] Physician Discharge Summary   Patient name: Patricia Ramos  Admit date:     12/26/2023  Discharge date: 12/28/2023  Attending Physician: CELINDA ALM LOT [[id number]]  Discharge Physician: Norval Bar   PCP: Claudene Prentice [name]., FNP  Admitted From: Home   Disposition:  Home  Recommendations for Outpatient Follow-up:  Follow up with PCP in 1-2 weeks Please get sleep study as scheduled Follow up with allergy clinic in 2-4 weeks Follow up with pulmonology in 2-4 weeks  Home Health:No Equipment/Devices: @ECDMELIST @  Discharge Condition:Stable CODE STATUS:FULL Diet recommendation: Heart Healthy Fluid Restriction: None  Hospital Summary:  78 y.o. female with medical history significant of anxiety, HTN, HLD, GERD, impaired fasting glucose, obesity (class II), vitamin D  deficiency, R knee OA (on oxycodone  prn), peripheral neuropathy (on gabapentin  prn), and recent OP Pulmonology evaluation on 11/25 w/ suspected OSA (follows w/ Dr. Theodoro who ordered OP sleep study, as well as Rozerem  for sleep onset given c/f delayed sleep phase disorder) who presented to the emergency department with complaints of progressively worse dyspnea, nonproductive cough and wheezing. ENT evaluated with low concern for upper airway pathology. Patient wanted to hold off on fibroptic scopy for upper airway evaluation. Admitted for possible acute asthma exacerbation. Treated with steroids, duonebs PRN, singular, pulmicort, tessalon perles PRN and guaifenesin PRN. - stable for discharge home to finish 5 days course of prednisone  - take tessalon perles PRN for cough - take guaifenesin PRN for cough - cont home albuterol  PRN - follow up with pulmonology clinic in 2-4 weeks - follow up with allergy clinic in 2-4 weeks - follow up with PCP within one week  Discharge Diagnoses:  Principal Problem:   Reactive airway disease with wheezing   Discharge Instructions  Discharge Instructions      Ambulatory referral to Allergy   Complete by: As directed    Diet - low sodium heart healthy   Complete by: As directed    Discharge instructions   Complete by: As directed    Finished prednisone  course Follow up with sleep study outpatient as scheduled Follow up with PCP within one week Follow up with pulmonology clinic in 2-4 weeks   Increase activity slowly   Complete by: As directed    Pulmonary Visit   Complete by: As directed    Concern for reactive airway disease   Reason for referral: Other Pulmonary      Allergies as of 12/28/2023       Reactions   Codeine Itching   Takes it with Benadryl  and tolerates it well.   Penicillins Rash   Did it involve swelling of the face/tongue/throat, SOB, or low BP? No Did it involve sudden or severe rash/hives, skin peeling, or any reaction on the inside of your mouth or nose? No Did you need to seek medical attention at a hospital or doctor's office? No When did it last happen?      childhood If all above answers are "NO", may proceed with cephalosporin use.        Medication List     TAKE these medications    acetaminophen  325 MG tablet Commonly known as: TYLENOL  Take 650 mg by mouth every 6 (six) hours as needed.   albuterol  108 (90 Base) MCG/ACT inhaler Commonly known as: VENTOLIN  HFA Inhale 2 puffs into the lungs every 6 (six) hours as needed.   atorvastatin  20 MG tablet Commonly known as: LIPITOR Take 1 tablet (20 mg total) by mouth daily.  benzonatate 100 MG capsule Commonly known as: TESSALON Take 1 capsule (100 mg total) by mouth 3 (three) times daily.   celecoxib 200 MG capsule Commonly known as: CELEBREX Take 200 mg by mouth as needed.   dexlansoprazole  60 MG capsule Commonly known as: DEXILANT  Take 1 capsule (60 mg total) by mouth daily.   diazepam  5 MG tablet Commonly known as: VALIUM  Take 5 mg by mouth every 8 (eight) hours as needed.   guaiFENesin 100 MG/5ML liquid Commonly known as:  ROBITUSSIN Take 15 mLs by mouth every 6 (six) hours as needed for cough or to loosen phlegm.   hydrochlorothiazide 12.5 MG tablet Commonly known as: HYDRODIURIL Take 12.5 mg by mouth daily.   HYDROcodone -acetaminophen  5-325 MG tablet Commonly known as: NORCO/VICODIN Take 1 tablet by mouth every 6 (six) hours as needed.   ibuprofen  800 MG tablet Commonly known as: ADVIL  Take 800 mg by mouth every 6 (six) hours as needed.   metoCLOPramide  10 MG tablet Commonly known as: REGLAN  Take 1 tablet (10 mg total) by mouth every 6 (six) hours.   mometasone  0.1 % cream Commonly known as: ELOCON  APPLY TO AFFECTED AREA AS NEEDED   OMEPRAZOLE PO Take 1 tablet by mouth daily.   predniSONE  20 MG tablet Commonly known as: DELTASONE  Take 2 tablets (40 mg total) by mouth daily with breakfast for 2 days. Start taking on: December 29, 2023   ramelteon  8 MG tablet Commonly known as: Rozerem  Take 1 tablet (8 mg total) by mouth at bedtime.        Follow-up Information     Schedule an appointment as soon as possible for a visit  with Claudene Prentice [name]., FNP.   Specialty: Family Medicine Contact information: [address] [place] [address] [phone]                Allergies  Allergen Reactions   Codeine Itching    Takes it with Benadryl  and tolerates it well.   Penicillins Rash    Did it involve swelling of the face/tongue/throat, SOB, or low BP? No Did it involve sudden or severe rash/hives, skin peeling, or any reaction on the inside of your mouth or nose? No Did you need to seek medical attention at a hospital or doctor's office? No When did it last happen?      childhood If all above answers are "NO", may proceed with cephalosporin use.     Discharge Exam: Vitals:   12/28/23 0727 12/28/23 1153  BP: 114/78 137/68  Pulse: 83 86  Resp: 18 18  Temp: 97.8 F (36.6 C) 98.1 F (36.7 C)  SpO2: 95% 94%    Physical Exam Vitals and nursing note reviewed.   Constitutional:      General: She is not in acute distress.    Appearance: She is obese. She is not ill-appearing.  HENT:     Head: Normocephalic and atraumatic.  Cardiovascular:     Rate and Rhythm: Normal rate and regular rhythm.     Pulses: Normal pulses.     Heart sounds: Normal heart sounds.  Pulmonary:     Effort: Pulmonary effort is normal.     Breath sounds: Normal breath sounds.  Abdominal:     General: Bowel sounds are normal.     Palpations: Abdomen is soft.  Neurological:     Mental Status: She is alert.     The results of significant diagnostics from this hospitalization (including imaging, microbiology, ancillary and laboratory) are listed  below for reference.    Microbiology: Recent Results (from the past 240 hours)  Resp panel by RT-PCR (RSV, Flu A&B, Covid) Anterior Nasal Swab     Status: None   Collection Time: 12/26/23  4:21 AM   Specimen: Anterior Nasal Swab  Result Value Ref Range Status   SARS Coronavirus 2 by RT PCR NEGATIVE NEGATIVE Final    Comment: (NOTE) SARS-CoV-2 target nucleic acids are NOT DETECTED.  The SARS-CoV-2 RNA is generally detectable in upper respiratory specimens during the acute phase of infection. The lowest concentration of SARS-CoV-2 viral copies this assay can detect is 138 copies/mL. A negative result does not preclude SARS-Cov-2 infection and should not be used as the sole basis for treatment or other patient management decisions. A negative result may occur with  improper specimen collection/handling, submission of specimen other than nasopharyngeal swab, presence of viral mutation(s) within the areas targeted by this assay, and inadequate number of viral copies(<138 copies/mL). A negative result must be combined with clinical observations, patient history, and epidemiological information. The expected result is Negative.  Fact Sheet for Patients:  bloggercourse.com  Fact Sheet for Healthcare  Providers:  seriousbroker.it  This test is no t yet approved or cleared by the [place]  FDA and  has been authorized for detection and/or diagnosis of SARS-CoV-2 by FDA under an Emergency Use Authorization (EUA). This EUA will remain  in effect (meaning this test can be used) for the duration of the COVID-19 declaration under Section 564(b)(1) of the Act, 21 U.S.C.section 360bbb-3(b)(1), unless the authorization is terminated  or revoked sooner.       Influenza A by PCR NEGATIVE NEGATIVE Final   Influenza B by PCR NEGATIVE NEGATIVE Final    Comment: (NOTE) The Xpert Xpress SARS-CoV-2/FLU/RSV plus assay is intended as an aid in the diagnosis of influenza from Nasopharyngeal swab specimens and should not be used as a sole basis for treatment. Nasal washings and aspirates are unacceptable for Xpert Xpress SARS-CoV-2/FLU/RSV testing.  Fact Sheet for Patients: bloggercourse.com  Fact Sheet for Healthcare Providers: seriousbroker.it  This test is not yet approved or cleared by the [place]  FDA and has been authorized for detection and/or diagnosis of SARS-CoV-2 by FDA under an Emergency Use Authorization (EUA). This EUA will remain in effect (meaning this test can be used) for the duration of the COVID-19 declaration under Section 564(b)(1) of the Act, 21 U.S.C. section 360bbb-3(b)(1), unless the authorization is terminated or revoked.     Resp Syncytial Virus by PCR NEGATIVE NEGATIVE Final    Comment: (NOTE) Fact Sheet for Patients: bloggercourse.com  Fact Sheet for Healthcare Providers: seriousbroker.it  This test is not yet approved or cleared by the [place]  FDA and has been authorized for detection and/or diagnosis of SARS-CoV-2 by FDA under an Emergency Use Authorization (EUA). This EUA will remain in effect (meaning this test can be  used) for the duration of the COVID-19 declaration under Section 564(b)(1) of the Act, 21 U.S.C. section 360bbb-3(b)(1), unless the authorization is terminated or revoked.  Performed at [facility], [address] [place], [place] [address]   Respiratory (~20 pathogens) panel by PCR     Status: Abnormal   Collection Time: 12/26/23 10:32 AM   Specimen: Nasopharyngeal Swab; Respiratory  Result Value Ref Range Status   Adenovirus NOT DETECTED NOT DETECTED Final   Coronavirus 229E NOT DETECTED NOT DETECTED Final    Comment: (NOTE) The Coronavirus on the Respiratory Panel, DOES NOT test for the novel  Coronavirus (2019 nCoV)    Coronavirus HKU1 NOT DETECTED NOT DETECTED Final   Coronavirus NL63 NOT DETECTED NOT DETECTED Final   Coronavirus OC43 NOT DETECTED NOT DETECTED Final   Metapneumovirus NOT DETECTED NOT DETECTED Final   Rhinovirus / Enterovirus DETECTED (A) NOT DETECTED Final   Influenza A NOT DETECTED NOT DETECTED Final   Influenza B NOT DETECTED NOT DETECTED Final   Parainfluenza Virus 1 NOT DETECTED NOT DETECTED Final   Parainfluenza Virus 2 NOT DETECTED NOT DETECTED Final   Parainfluenza Virus 3 NOT DETECTED NOT DETECTED Final   Parainfluenza Virus 4 NOT DETECTED NOT DETECTED Final   Respiratory Syncytial Virus NOT DETECTED NOT DETECTED Final   Bordetella pertussis NOT DETECTED NOT DETECTED Final   Bordetella Parapertussis NOT DETECTED NOT DETECTED Final   Chlamydophila pneumoniae NOT DETECTED NOT DETECTED Final   Mycoplasma pneumoniae NOT DETECTED NOT DETECTED Final    Comment: Performed at [facility], [address] [place], [place] [address]     Labs: ProBNP, BNP (last 5 results) Recent Labs    12/26/23 0421  PROBNP 60.0   Basic Metabolic Panel: Recent Labs  Lab 12/26/23 0421 12/26/23 1015 12/27/23 0824  NA 146* 146* 140  K 3.4* 3.2* 3.9  CL 108 108 100  CO2 29 25 26   GLUCOSE 113* 154* 130*  BUN 18 14 10   CREATININE 0.60  0.77 0.78  CALCIUM  10.4* 9.3 9.3  MG  --  2.1  --    Liver Function Tests: Recent Labs  Lab 12/26/23 0421  AST 23  ALT 22  ALKPHOS 88  BILITOT <0.2  PROT 7.0  ALBUMIN 4.1   No results for input(s): LIPASE, AMYLASE in the last 168 hours. No results for input(s): AMMONIA in the last 168 hours. CBC: Recent Labs  Lab 12/26/23 0421 12/27/23 1353  WBC 8.7 14.9*  NEUTROABS 4.1  --   HGB 13.2 12.5  HCT 41.5 38.8  MCV 88.1 87.8  PLT 331 329   Cardiac Enzymes: No results for input(s): CKTOTAL, CKMB, CKMBINDEX, TROPONINI, TROPONINIHS in the last 168 hours. BNP: No results for input(s): BNP in the last 168 hours. CBG: No results for input(s): GLUCAP in the last 168 hours. D-Dimer Recent Labs    12/26/23 0421  DDIMER 0.53*   Hgb A1c No results for input(s): HGBA1C in the last 72 hours. Lipid Profile No results for input(s): CHOL, HDL, LDLCALC, TRIG, CHOLHDL, LDLDIRECT in the last 72 hours. Thyroid  function studies No results for input(s): TSH, T4TOTAL, FREET4, T3FREE, THYROIDAB in the last 72 hours.  Invalid input(s): FREET3 Anemia work up No results for input(s): VITAMINB12, FOLATE, FERRITIN, TIBC, IRON, RETICCTPCT in the last 72 hours. Urinalysis    Component Value Date/Time   COLORURINE LT. YELLOW 12/04/2011 0837   APPEARANCEUR SL CLOUDY 12/04/2011 0837   LABSPEC 1.025 12/04/2011 0837   PHURINE 6.0 12/04/2011 0837   GLUCOSEU NEGATIVE 12/04/2011 0837   HGBUR NEGATIVE 12/04/2011 0837   BILIRUBINUR NEGATIVE 12/04/2011 0837   KETONESUR NEGATIVE 12/04/2011 0837   UROBILINOGEN 0.2 12/04/2011 0837   NITRITE NEGATIVE 12/04/2011 0837   LEUKOCYTESUR NEGATIVE 12/04/2011 0837   Sepsis Labs Recent Labs  Lab 12/26/23 0421 12/26/23 1015 12/27/23 1353  PROCALCITON  --  <0.10  --   WBC 8.7  --  14.9*    Procedures/Studies: DG Neck Soft Tissue Result Date: 12/26/2023 EXAM: 1 VIEW XRAY OF THE SOFT TISSUE NECK  12/26/2023 06:58:54 AM COMPARISON: None available. CLINICAL HISTORY: stridor FINDINGS: SOFT  TISSUES: Unremarkable. No retropharyngeal soft tissue swelling or gas. EPIGLOTTIS: No epiglottic thickening. BONES: There is moderate chronic degenerative disc disease at C3-C4, C4-C5, C5-C6 and C6-C7. There are prominent anterior osteophytes present at each of these levels. IMPRESSION: 1. No acute findings. 2. 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 Electronically signed by: Evalene Coho MD 12/26/2023 07:05 AM EST RP Workstation: [id number]   DG Chest Port 1 View Result Date: 12/26/2023 EXAM: 1 VIEW(S) XRAY OF THE CHEST 12/26/2023 04:37:00 AM COMPARISON: PA and lateral radiographs of the chest dated 12/28/2017. CLINICAL HISTORY: SOB (shortness of breath) FINDINGS: LUNGS AND PLEURA: No focal pulmonary opacity. No pleural effusion. No pneumothorax. HEART AND MEDIASTINUM: No acute abnormality of the cardiac and mediastinal silhouettes. BONES AND SOFT TISSUES: No acute osseous abnormality. IMPRESSION: 1. No acute process. Electronically signed by: Evalene Coho MD 12/26/2023 04:39 AM EST RP Workstation: [id number]    Time coordinating discharge: 40 mins  SIGNED:  Norval Bar, MD [facility] 12/28/23, 12:22 PM

## 2024-02-14 ENCOUNTER — Emergency Department (HOSPITAL_BASED_OUTPATIENT_CLINIC_OR_DEPARTMENT_OTHER)

## 2024-02-14 ENCOUNTER — Other Ambulatory Visit: Payer: Self-pay

## 2024-02-14 ENCOUNTER — Emergency Department (HOSPITAL_BASED_OUTPATIENT_CLINIC_OR_DEPARTMENT_OTHER): Admission: EM | Admit: 2024-02-14 | Discharge: 2024-02-14 | Disposition: A | Discharge status: Home / Self Care

## 2024-02-14 DIAGNOSIS — J4541 Moderate persistent asthma with (acute) exacerbation: Secondary | ICD-10-CM | POA: Insufficient documentation

## 2024-02-14 DIAGNOSIS — R0602 Shortness of breath: Secondary | ICD-10-CM

## 2024-02-14 DIAGNOSIS — R059 Cough, unspecified: Secondary | ICD-10-CM | POA: Diagnosis present

## 2024-02-14 DIAGNOSIS — Z7951 Long term (current) use of inhaled steroids: Secondary | ICD-10-CM | POA: Diagnosis not present

## 2024-02-14 LAB — BASIC METABOLIC PANEL WITH GFR
Anion gap: 14 (ref 5–15)
BUN: 12 mg/dL (ref 8–23)
CO2: 25 mmol/L (ref 22–32)
Calcium: 10.6 mg/dL — ABNORMAL HIGH (ref 8.9–10.3)
Chloride: 102 mmol/L (ref 98–111)
Creatinine, Ser: 0.73 mg/dL (ref 0.44–1.00)
GFR, Estimated: >60 mL/min
Glucose, Bld: 117 mg/dL — ABNORMAL HIGH (ref 70–99)
Potassium: 4.1 mmol/L (ref 3.5–5.1)
Sodium: 141 mmol/L (ref 135–145)

## 2024-02-14 LAB — I-STAT VENOUS BLOOD GAS, ED
Acid-Base Excess: 0 mmol/L (ref 0.0–2.0)
Bicarbonate: 26.2 mmol/L (ref 20.0–28.0)
Calcium, Ion: 1.29 mmol/L (ref 1.15–1.40)
HCT: 41 % (ref 36.0–46.0)
Hemoglobin: 13.9 g/dL (ref 12.0–15.0)
O2 Saturation: 77 %
Patient temperature: 98.6
Potassium: 3 mmol/L — ABNORMAL LOW (ref 3.5–5.1)
Sodium: 142 mmol/L (ref 135–145)
TCO2: 28 mmol/L (ref 22–32)
pCO2, Ven: 46.2 mmHg (ref 44–60)
pH, Ven: 7.362 (ref 7.25–7.43)
pO2, Ven: 44 mmHg (ref 32–45)

## 2024-02-14 LAB — CBC
HCT: 41.6 % (ref 36.0–46.0)
Hemoglobin: 13.7 g/dL (ref 12.0–15.0)
MCH: 28.7 pg (ref 26.0–34.0)
MCHC: 32.9 g/dL (ref 30.0–36.0)
MCV: 87 fL (ref 80.0–100.0)
Platelets: 327 K/uL (ref 150–400)
RBC: 4.78 MIL/uL (ref 3.87–5.11)
RDW: 13.2 % (ref 11.5–15.5)
WBC: 7.7 K/uL (ref 4.0–10.5)
nRBC: 0 % (ref 0.0–0.2)

## 2024-02-14 LAB — RESP PANEL BY RT-PCR (RSV, FLU A&B, COVID)  RVPGX2
Influenza A by PCR: NEGATIVE
Influenza B by PCR: NEGATIVE
Resp Syncytial Virus by PCR: NEGATIVE
SARS Coronavirus 2 by RT PCR: NEGATIVE

## 2024-02-14 MED ORDER — ALBUTEROL (5 MG/ML) CONTINUOUS INHALATION SOLN: 10.0000 mg/h | INHALATION_SOLUTION | RESPIRATORY_TRACT | Status: AC

## 2024-02-14 MED ORDER — ALBUTEROL SULFATE (2.5 MG/3ML) 0.083% IN NEBU
INHALATION_SOLUTION | RESPIRATORY_TRACT | Status: AC
  Administered 2024-02-14: 10 mg
  Filled 2024-02-14: qty 12

## 2024-02-14 MED ORDER — METHYLPREDNISOLONE SODIUM SUCC 125 MG IJ SOLR
125.0000 mg | Freq: Once | INTRAMUSCULAR | Status: AC
  Administered 2024-02-14: 125 mg via INTRAVENOUS
  Filled 2024-02-14: qty 2

## 2024-02-14 MED ORDER — GUAIFENESIN 100 MG/5ML PO LIQD
5.0000 mL | Freq: Once | ORAL | Status: AC
  Administered 2024-02-14: 5 mL via ORAL
  Filled 2024-02-14: qty 10

## 2024-02-14 MED ORDER — PREDNISONE 10 MG PO TABS: 40.0000 mg | ORAL_TABLET | Freq: Every day | ORAL | 0 refills | Status: AC

## 2024-02-14 MED ORDER — BUDESONIDE 0.25 MG/2ML IN SUSP: 0.2500 mg | Freq: Every day | RESPIRATORY_TRACT | 12 refills | Status: AC

## 2024-02-14 MED ORDER — IPRATROPIUM BROMIDE 0.02 % IN SOLN
1.0000 mg | Freq: Once | RESPIRATORY_TRACT | Status: AC
  Administered 2024-02-14: 1 mg via RESPIRATORY_TRACT
  Filled 2024-02-14: qty 5

## 2024-02-14 MED ORDER — BENZONATATE 100 MG PO CAPS
100.0000 mg | ORAL_CAPSULE | Freq: Once | ORAL | Status: AC
  Administered 2024-02-14: 100 mg via ORAL
  Filled 2024-02-14: qty 1

## 2024-02-14 MED ORDER — ALBUTEROL SULFATE HFA 108 (90 BASE) MCG/ACT IN AERS
2.0000 | INHALATION_SPRAY | Freq: Once | RESPIRATORY_TRACT | Status: AC
  Administered 2024-02-14: 2 via RESPIRATORY_TRACT
  Filled 2024-02-14: qty 6.7

## 2024-02-14 MED ORDER — ACETAMINOPHEN 325 MG PO TABS
650.0000 mg | ORAL_TABLET | Freq: Once | ORAL | Status: AC
  Administered 2024-02-14: 650 mg via ORAL
  Filled 2024-02-14: qty 2

## 2024-02-14 MED ORDER — ALBUTEROL SULFATE HFA 108 (90 BASE) MCG/ACT IN AERS: 2.0000 | INHALATION_SPRAY | Freq: Four times a day (QID) | RESPIRATORY_TRACT | 1 refills | Status: AC | PRN

## 2024-02-14 MED ORDER — IOHEXOL 350 MG/ML SOLN
75.0000 mL | Freq: Once | INTRAVENOUS | Status: AC | PRN
  Administered 2024-02-14: 75 mL via INTRAVENOUS

## 2024-02-14 MED ORDER — GUAIFENESIN-CODEINE 100-10 MG/5ML PO SOLN: 5.0000 mL | Freq: Three times a day (TID) | ORAL | 0 refills | Status: AC | PRN

## 2024-02-14 MED ORDER — ALBUTEROL SULFATE (2.5 MG/3ML) 0.083% IN NEBU: 2.5000 mg | INHALATION_SOLUTION | Freq: Four times a day (QID) | RESPIRATORY_TRACT | 12 refills | Status: AC | PRN

## 2024-02-14 MED ORDER — AEROCHAMBER PLUS FLO-VU MEDIUM MISC
1.0000 | Freq: Once | Status: AC
  Administered 2024-02-14: 1

## 2024-02-14 NOTE — ED Notes (Signed)
 Pt discharged to home using teachback Method. Discharge instructions have been discussed with patient and/or family members. Pt verbally acknowledges understanding d/c instructions, has been given opportunity for questions to be answered, and endorses comprehension to checkout at registration before leaving. Pt a/o at the time of discharge. RR even and unlabored. No acute distress noted.  Pt ambulated with pulse ox prior to d/c, o2 sats maintained between 92-94%. Pt denies shob, no s/s of resp distress when ambulating. EDP Tee Notified.

## 2024-02-14 NOTE — Discharge Instructions (Addendum)
 Please follow up with your pulmonologist.  As we discussed extensively, I did recommend inpatient admission given your high heart rate, low oxygen saturation.  You elected to go home.  You understand that this is a risk.  If you get worse, please come to the ED for further evaluation  I will call in all your medication.  Include all nebulizer solutions as well as inhalers   Please take the steroids as prescribed

## 2024-02-14 NOTE — ED Provider Notes (Signed)
 " [place] EMERGENCY DEPARTMENT AT [facility] Provider Note   CSN: [id number] Arrival date & time: 02/14/24  9760     Patient presents with: Asthma   Patricia Ramos is a 79 y.o. female.    Asthma Pertinent negatives include no chest pain, no abdominal pain and no shortness of breath.   Patient presents because of shortness of breath.  Dors and cough.  Patient states that she has had a chronic cough ever since last admission.  Patient states that she has been have increasing shortness of breath over the past couple days.  Has been using inhalers at home without obvious improvement.  Was helping initially but seems to have decreased in terms of efficacy.  No obvious fever or chills.  No sick contacts that she is aware of.  No chest pain.  No prior chest pain.  No hemoptysis.  Denies any history of DVT or PE.  No cancer history.  No recent travel history.  No leg swelling or pain.  No exertional chest pain.  No nausea vomit diarrhea.  No dysuria.     Previous medical history reviewed : Patient last discharged December 28, 2023.  Shortness of breath.  Nonproductive cough and wheezing.  Possible acute asthma exacerbation.  Treated with steroids DuoNebs.  Improved.  Recommend follow-up with pulmonology.  Prior to Admission medications  Medication Sig Start Date End Date Taking? Authorizing Provider  albuterol  (PROVENTIL ) (2.5 MG/3ML) 0.083% nebulizer solution Take 3 mLs (2.5 mg total) by nebulization every 6 (six) hours as needed for wheezing or shortness of breath. 02/14/24  Yes Simon Lavonia SAILOR, MD  budesonide  (PULMICORT ) 0.25 MG/2ML nebulizer solution Take 2 mLs (0.25 mg total) by nebulization daily. 02/14/24  Yes Simon Lavonia SAILOR, MD  guaiFENesin -codeine  100-10 MG/5ML syrup Take 5 mLs by mouth 3 (three) times daily as needed for cough. 02/14/24  Yes Simon Lavonia SAILOR, MD  predniSONE  (DELTASONE ) 10 MG tablet Take 4 tablets (40 mg total) by mouth daily for 5 days. 02/14/24 02/19/24 Yes Simon Lavonia SAILOR, MD  acetaminophen  (TYLENOL ) 325 MG tablet Take 650 mg by mouth every 6 (six) hours as needed.    [provider]  albuterol  (VENTOLIN  HFA) 108 (90 Base) MCG/ACT inhaler Inhale 2 puffs into the lungs every 6 (six) hours as needed. 02/14/24   Simon Lavonia SAILOR, MD  atorvastatin  (LIPITOR) 20 MG tablet Take 1 tablet (20 mg total) by mouth daily. Patient not taking: Reported on 12/22/2023 09/16/18   Mona Vinie BROCKS, MD  benzonatate  (TESSALON ) 100 MG capsule Take 1 capsule (100 mg total) by mouth 3 (three) times daily. 12/28/23   Cosette Blackwater, MD  celecoxib (CELEBREX) 200 MG capsule Take 200 mg by mouth as needed. 10/19/23   [provider]  dexlansoprazole  (DEXILANT ) 60 MG capsule Take 1 capsule (60 mg total) by mouth daily. 08/17/18   Prentiss Frieze, DO  diazepam  (VALIUM ) 5 MG tablet Take 5 mg by mouth every 8 (eight) hours as needed. 10/23/22   [provider]  guaiFENesin  (ROBITUSSIN) 100 MG/5ML liquid Take 15 mLs by mouth every 6 (six) hours as needed for cough or to loosen phlegm. 12/28/23   Cosette Blackwater, MD  hydrochlorothiazide  (HYDRODIURIL ) 12.5 MG tablet Take 12.5 mg by mouth daily. 10/25/21   [provider]  HYDROcodone -acetaminophen  (NORCO/VICODIN) 5-325 MG tablet Take 1 tablet by mouth every 6 (six) hours as needed. 08/17/23   [provider]  ibuprofen  (ADVIL ) 800 MG tablet Take 800 mg by mouth every  6 (six) hours as needed. 02/24/12   [provider]  metoCLOPramide  (REGLAN ) 10 MG tablet Take 1 tablet (10 mg total) by mouth every 6 (six) hours. Patient not taking: Reported on 12/22/2023 11/02/21   Conklin, Erica R, PA-C  mometasone  (ELOCON ) 0.1 % cream APPLY TO AFFECTED AREA AS NEEDED 12/18/17   Scarlet Elsie LABOR, MD  OMEPRAZOLE PO Take 1 tablet by mouth daily.    [provider]  ramelteon  (ROZEREM ) 8 MG tablet Take 1 tablet (8 mg total) by mouth at bedtime. 12/22/23   Pawar, Rahul, MD    Allergies: Codeine  and Penicillins     Review of Systems  Constitutional:  Negative for chills and fever.  HENT:  Negative for ear pain and sore throat.   Eyes:  Negative for pain and visual disturbance.  Respiratory:  Negative for cough and shortness of breath.   Cardiovascular:  Negative for chest pain and palpitations.  Gastrointestinal:  Negative for abdominal pain and vomiting.  Genitourinary:  Negative for dysuria and hematuria.  Musculoskeletal:  Negative for arthralgias and back pain.  Skin:  Negative for color change and rash.  Neurological:  Negative for seizures and syncope.  All other systems reviewed and are negative.   Updated Vital Signs BP 120/75   Pulse (!) 114   Temp 98.2 F (36.8 C) (Oral)   Resp 16   SpO2 94%   Physical Exam Vitals and nursing note reviewed.  Constitutional:      General: She is not in acute distress.    Appearance: She is well-developed.  HENT:     Head: Normocephalic and atraumatic.  Eyes:     Conjunctiva/sclera: Conjunctivae normal.  Cardiovascular:     Rate and Rhythm: Normal rate and regular rhythm.     Heart sounds: No murmur heard. Pulmonary:     Effort: Pulmonary effort is normal. No respiratory distress.     Breath sounds: Wheezing present.  Abdominal:     Palpations: Abdomen is soft.     Tenderness: There is no abdominal tenderness.  Musculoskeletal:        General: No swelling.     Cervical back: Neck supple.  Skin:    General: Skin is warm and dry.     Capillary Refill: Capillary refill takes less than 2 seconds.  Neurological:     Mental Status: She is alert.  Psychiatric:        Mood and Affect: Mood normal.     (all labs ordered are listed, but only abnormal results are displayed) Labs Reviewed  BASIC METABOLIC PANEL WITH GFR - Abnormal; Notable for the following components:      Result Value   Glucose, Bld 117 (*)    Calcium  10.6 (*)    All other components within normal limits  I-STAT VENOUS BLOOD GAS, ED - Abnormal; Notable for the  following components:   Potassium 3.0 (*)    All other components within normal limits  RESP PANEL BY RT-PCR (RSV, FLU A&B, COVID)  [id number]  CBC    EKG: EKG Interpretation Date/Time:  Sunday February 14 2024 07:15:44 EST Ventricular Rate:  113 PR Interval:  76 QRS Duration:  101 QT Interval:  337 QTC Calculation: 462 R Axis:   -22  Text Interpretation: Sinus tachycardia Borderline left axis deviation Borderline repolarization abnormality Confirmed by Simon Rea [phone]) on 02/14/2024 7:27:59 AM  Radiology: CT Angio Chest PE W and/or Wo Contrast Result Date: 02/14/2024 EXAM: CTA CHEST 02/14/2024 06:33:38 AM TECHNIQUE: CTA  of the chest was performed after the administration of 75 mL of iohexol  (OMNIPAQUE ) 350 MG/ML injection. Multiplanar reformatted images are provided for review. MIP images are provided for review. Automated exposure control, iterative reconstruction, and/or weight based adjustment of the mA/kV was utilized to reduce the radiation dose to as low as reasonably achievable. COMPARISON: None available. CLINICAL HISTORY: Rule out PE. SOB. Tachycardia. rule out infiltrate. FINDINGS: PULMONARY ARTERIES: Pulmonary arteries are adequately opacified for evaluation. No acute pulmonary embolus. Main pulmonary artery is normal in caliber. MEDIASTINUM: The heart and pericardium demonstrate no acute abnormality. There is no acute abnormality of the thoracic aorta. Air-filled patulous esophagus is noted, which may reflect chronic dysmotility. LYMPH NODES: No mediastinal, hilar or axillary lymphadenopathy. LUNGS AND PLEURA: Benign nodule along the oblique fissure of the left mid lung measuring 3 mm, image 52/7. Likely intrapulmonary lymph node. Posterior right upper lobe nodule measures 3 mm, image 42/7. A nodule within the posterior right lower lobe measures 3 mm. Refer to image 86/7. Airway noted in the posterior medial left lower lobe. No focal consolidation or pulmonary edema. No evidence of  pleural effusion or pneumothorax. UPPER ABDOMEN: Small hiatal hernia. 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 No follow-up imaging recommended for bosniak class I cyst, unless there is a history of malignancy or otherwise clinically indicated. SOFT TISSUES AND BONES: No acute bone or soft tissue abnormality. IMPRESSION: 1. No pulmonary embolism or acute pulmonary abnormality. 2. Small nodules are identified within the lungs measuring up to 3 mm. In a patient who was at low risk, no further follow-up is indicated. If the patient is at increased risk a follow-up CT of the chest without contrast material in 12 months may be considered Electronically signed by: Waddell Calk MD 02/14/2024 07:00 AM EST RP Workstation: [id number]   DG Chest Port 1 View Result Date: 02/14/2024 EXAM: 1 VIEW(S) XRAY OF THE CHEST 02/14/2024 03:34:00 AM COMPARISON: 12/26/2023 CLINICAL HISTORY: Shortness of breath and cough. FINDINGS: LUNGS AND PLEURA: No focal pulmonary opacity. No pleural effusion. No pneumothorax. HEART AND MEDIASTINUM: No acute abnormality of the cardiac and mediastinal silhouettes. BONES AND SOFT TISSUES: No acute osseous abnormality. IMPRESSION: 1. No acute cardiopulmonary findings. Electronically signed by: Oneil Devonshire MD 02/14/2024 03:48 AM EST RP Workstation: [id number]     Procedures   Medications Ordered in the ED  albuterol  (PROVENTIL ,VENTOLIN ) solution continuous neb (10 mg/hr Nebulization Not Given 02/14/24 0303)  ipratropium (ATROVENT ) nebulizer solution 1 mg (1 mg Nebulization Given 02/14/24 0304)  albuterol  (PROVENTIL ) (2.5 MG/3ML) 0.083% nebulizer solution (10 mg  Given 02/14/24 0303)  methylPREDNISolone  sodium succinate (SOLU-MEDROL ) 125 mg/2 mL injection 125 mg (125 mg Intravenous Given 02/14/24 0428)  benzonatate  (TESSALON ) capsule 100 mg (100 mg Oral Given 02/14/24 0639)  guaiFENesin  (ROBITUSSIN) 100 MG/5ML liquid 5 mL (5 mLs  Oral Given 02/14/24 0639)  iohexol  (OMNIPAQUE ) 350 MG/ML injection 75 mL (75 mLs Intravenous Contrast Given 02/14/24 0625)  acetaminophen  (TYLENOL ) tablet 650 mg (650 mg Oral Given 02/14/24 0639)  albuterol  (VENTOLIN  HFA) 108 (90 Base) MCG/ACT inhaler 2 puff (2 puffs Inhalation Given 02/14/24 0733)  AeroChamber Plus Flo-Vu Medium MISC 1 each (1 each Other Given 02/14/24 0734)    Clinical Course as of 02/14/24 0736  Sun Feb 14, 2024  0419 DG Chest [place] 1 View [TL]    Clinical Course User Index [TL] Simon Lavonia SAILOR, MD  Medical Decision Making Amount and/or Complexity of Data Reviewed Labs: ordered. Radiology: ordered. Decision-making details documented in ED Course.  Risk OTC drugs. Prescription drug management.     HPI:   Patient presents because of shortness of breath.  Dors and cough.  Patient states that she has had a chronic cough ever since last admission.  Patient states that she has been have increasing shortness of breath over the past couple days.  Has been using inhalers at home without obvious improvement.  Was helping initially but seems to have decreased in terms of efficacy.  No obvious fever or chills.  No sick contacts that she is aware of.  No chest pain.  No prior chest pain.  No hemoptysis.  Denies any history of DVT or PE.  No cancer history.  No recent travel history.  No leg swelling or pain.  No exertional chest pain.  No nausea vomit diarrhea.  No dysuria.     Previous medical history reviewed : Patient last discharged December 28, 2023.  Shortness of breath.  Nonproductive cough and wheezing.  Possible acute asthma exacerbation.  Treated with steroids DuoNebs.  Improved.  Recommend follow-up with pulmonology.  MDM:   Upon examination, patient tachycardic.  Inspiratory expiratory wheezing.  Patient had already received continuous albuterol  inhaler.  Given wheezing, do question whether or not this could be asthma.   Questionable asthma diagnosis in the past.  Could be exacerbated by possible upper respiratory infection.  Obtain COVID RSV and flu.  Chest x-ray reviewed.  No evidence of pneumonia.  Will also obtain VBG to see if patient is retaining.  Patient been seen in the past by ENT during last admission to rule out any kind of upper respiratory obstruction but felt like this is unlikely.  No obvious stridor on exam  Will give dose of Solu-Medrol  in setting of concern for asthma exacerbation.   Reevaluation:   Upon reexamination, patient work of breathing improved.  Still has expiratory wheezing.  Tachycardia into approximately 110 sinus tachycardia.  Given persistent tachycardia, did obtain CTA chest.  No PE.  No infiltrate.  Nonspecific nodules but otherwise no acute pathology.  VBG unremarkable.  No hypercapnia.  I do wonder if this could be asthma exacerbation complicated by viral process.  Given significant wheezing as well as tachycardia, I did recommend admission for the patient.  Patient refused.  Patient states that she understands the risk of going home including worsening medical outcomes and/or death.  Daughter at bedside.  Understands risk.  Counseled the patient at length about staying here in the hospital.  Patient does not want to  They cannot find her albuterol  inhaler as well.  This will be refilled here.  Start patient on Robitussin as well for the cough.  Called in her medications to the pharmacy.  Also give her actual albuterol  inhaler with spacer here in the ED.  Recommend strong return precautions.  Explained to the daughter as well as patient that she needs to follow-up with pulmonology given her recurrent symptoms.    Interventions: albuterol  cont,   EKG Interpreted by Me: sinus tach    Cardiac Tele Interpreted by Me: sinus    I have independently interpreted the CXR  and CT  images and agree with the radiologist finding   Social Determinant of Health: denies tobacco  abuse    Disposition and Follow Up: discharge       Final diagnoses:  Moderate persistent asthma with exacerbation  Shortness of breath  ED Discharge Orders          Ordered    albuterol  (VENTOLIN  HFA) 108 (90 Base) MCG/ACT inhaler  Every 6 hours PRN        02/14/24 0723    albuterol  (PROVENTIL ) (2.5 MG/3ML) 0.083% nebulizer solution  Every 6 hours PRN        02/14/24 0723    predniSONE  (DELTASONE ) 10 MG tablet  Daily        02/14/24 0723    guaiFENesin -codeine  100-10 MG/5ML syrup  3 times daily PRN        02/14/24 0723    budesonide  (PULMICORT ) 0.25 MG/2ML nebulizer solution  Daily        02/14/24 0734               Simon Lavonia SAILOR, MD 02/14/24 [phone]  "

## 2024-02-14 NOTE — ED Triage Notes (Addendum)
 URI last month coughing since. Cough worse x2 days/ HX asthma. SOB. Steroid PTA no inhaler.

## 2024-03-23 ENCOUNTER — Ambulatory Visit

## 2024-03-28 ENCOUNTER — Ambulatory Visit: Admitting: Allergy
